# Patient Record
Sex: Female | Born: 1995 | Race: Black or African American | Hispanic: No | Marital: Single | Smoking: Current every day smoker
Health system: Southern US, Community
[De-identification: ages and names within clinical notes are randomized; demographics above are authoritative.]

## PROBLEM LIST (undated history)

## (undated) DIAGNOSIS — Z789 Other specified health status: Secondary | ICD-10-CM

## (undated) DIAGNOSIS — H9190 Unspecified hearing loss, unspecified ear: Secondary | ICD-10-CM

## (undated) HISTORY — PX: NO PAST SURGERIES: SHX2092

---
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
## 2017-10-07 ENCOUNTER — Encounter (HOSPITAL_COMMUNITY): Payer: Self-pay | Admitting: Emergency Medicine

## 2017-10-07 ENCOUNTER — Emergency Department (HOSPITAL_COMMUNITY)
Admission: EM | Admit: 2017-10-07 | Discharge: 2017-10-08 | Disposition: A | Discharge status: Home / Self Care | Payer: Medicaid Other | Attending: Emergency Medicine | Admitting: Emergency Medicine

## 2017-10-07 DIAGNOSIS — R109 Unspecified abdominal pain: Principal | ICD-10-CM

## 2017-10-07 DIAGNOSIS — F17200 Nicotine dependence, unspecified, uncomplicated: Secondary | ICD-10-CM

## 2017-10-07 DIAGNOSIS — N898 Other specified noninflammatory disorders of vagina: Secondary | ICD-10-CM

## 2017-10-07 LAB — URINALYSIS, ROUTINE W REFLEX MICROSCOPIC
BILIRUBIN URINE: NEGATIVE
Glucose, UA: NEGATIVE mg/dL
KETONES UR: NEGATIVE mg/dL
Nitrite: NEGATIVE
PH: 6 (ref 5.0–8.0)
Protein, ur: NEGATIVE mg/dL
Specific Gravity, Urine: 1.021 (ref 1.005–1.030)

## 2017-10-07 LAB — COMPREHENSIVE METABOLIC PANEL
ALT: 14 U/L (ref 14–54)
ANION GAP: 8 (ref 5–15)
AST: 19 U/L (ref 15–41)
Albumin: 4.2 g/dL (ref 3.5–5.0)
Alkaline Phosphatase: 69 U/L (ref 38–126)
BUN: 8 mg/dL (ref 6–20)
CO2: 22 mmol/L (ref 22–32)
Calcium: 9.3 mg/dL (ref 8.9–10.3)
Chloride: 106 mmol/L (ref 101–111)
Creatinine, Ser: 0.79 mg/dL (ref 0.44–1.00)
GFR calc Af Amer: >60 mL/min (ref >60)
Glucose, Bld: 98 mg/dL (ref 65–99)
POTASSIUM: 3.5 mmol/L (ref 3.5–5.1)
SODIUM: 136 mmol/L (ref 135–145)
Total Bilirubin: 0.4 mg/dL (ref 0.3–1.2)
Total Protein: 7.1 g/dL (ref 6.5–8.1)

## 2017-10-07 LAB — CBC WITH DIFFERENTIAL/PLATELET
Basophils Absolute: 0 10*3/uL (ref 0.0–0.1)
Basophils Relative: 0 %
EOS ABS: 0.3 10*3/uL (ref 0.0–0.7)
EOS PCT: 3 %
HCT: 36.6 % (ref 36.0–46.0)
Hemoglobin: 11.9 g/dL — ABNORMAL LOW (ref 12.0–15.0)
LYMPHS PCT: 36 %
Lymphs Abs: 3.5 10*3/uL (ref 0.7–4.0)
MCH: 27.4 pg (ref 26.0–34.0)
MCHC: 32.5 g/dL (ref 30.0–36.0)
MCV: 84.3 fL (ref 78.0–100.0)
MONO ABS: 0.5 10*3/uL (ref 0.1–1.0)
Monocytes Relative: 5 %
Neutro Abs: 5.6 10*3/uL (ref 1.7–7.7)
Neutrophils Relative %: 56 %
PLATELETS: 300 10*3/uL (ref 150–400)
RBC: 4.34 MIL/uL (ref 3.87–5.11)
RDW: 13.5 % (ref 11.5–15.5)
WBC: 10 10*3/uL (ref 4.0–10.5)

## 2017-10-07 LAB — I-STAT BETA HCG BLOOD, ED (MC, WL, AP ONLY): I-stat hCG, quantitative: <5 m[IU]/mL (ref <5)

## 2017-10-07 NOTE — ED Provider Notes (Signed)
[facility]ENCY DEPT Provider Note   CSN: [id number] Arrival date & time: 10/07/17  1957     History   Chief Complaint Chief Complaint  Patient presents with  . Flank Pain    HPI Karen [name] is a 21 y.o. female.  HPI   Presents with right sided back pain Lower abdominal cramping Started on Friday, was severe Friday, since then has improved Has a history of kidney stones and this felt similar Back pain now is about a 7/10 No current abdominal pain, it occurs about 3 times daily and lasts about 1 minute. No dysuria Did note some blood in the urine earlier today Ibuprofen helping with pain  Vaginal discharge, with odor for 3 weeks No fevers Sexually active, not using condoms  History reviewed. No pertinent past medical history.  There are no active problems to display for this patient.   History reviewed. No pertinent surgical history.  OB History    No data available       Home Medications    Prior to Admission medications   Medication Sig Start Date End Date Taking? Authorizing Provider  acetaminophen (TYLENOL) 500 MG tablet Take 500 mg by mouth every 6 (six) hours as needed (pain).    [provider]  cephALEXin (KEFLEX) 500 MG capsule Take 1 capsule (500 mg total) by mouth 3 (three) times daily. 08/04/14   Santiago Glad, PA-C  ondansetron (ZOFRAN ODT) 4 MG disintegrating tablet Take 1 tablet (4 mg total) by mouth every 8 (eight) hours as needed for nausea or vomiting. 10/08/17   Alvira Monday, MD  tamsulosin (FLOMAX) 0.4 MG CAPS capsule Take 1 capsule (0.4 mg total) by mouth daily. 10/08/17 10/22/17  Alvira Monday, MD  traMADol (ULTRAM) 50 MG tablet Take 1 tablet (50 mg total) by mouth every 6 (six) hours as needed. 08/04/14   Santiago Glad, PA-C    Family History No family history on file.  Social History Social History  Substance Use Topics  . Smoking status: Current Some Day Smoker  . Smokeless tobacco: Never Used  . Alcohol  use Yes     Allergies   Patient has no known allergies.   Review of Systems Review of Systems  Constitutional: Negative for fever.  HENT: Negative for sore throat.   Eyes: Negative for visual disturbance.  Respiratory: Negative for cough and shortness of breath.   Cardiovascular: Negative for chest pain.  Gastrointestinal: Positive for abdominal pain and nausea. Negative for constipation, diarrhea and vomiting.  Genitourinary: Positive for flank pain and vaginal discharge. Negative for difficulty urinating and dysuria.  Musculoskeletal: Negative for back pain and neck pain.  Skin: Negative for rash.  Neurological: Negative for syncope and headaches.     Physical Exam Updated Vital Signs BP 125/70 (BP Location: Right Arm)   Pulse 80   Temp 98.6 F (37 C) (Oral)   Resp 14   SpO2 100%   Physical Exam  Constitutional: She is oriented to person, place, and time. She appears well-developed and well-nourished. No distress.  HENT:  Head: Normocephalic and atraumatic.  Eyes: Conjunctivae and EOM are normal.  Neck: Normal range of motion.  Cardiovascular: Normal rate, regular rhythm, normal heart sounds and intact distal pulses.  Exam reveals no gallop and no friction rub.   No murmur heard. Pulmonary/Chest: Effort normal and breath sounds normal. No respiratory distress. She has no wheezes. She has no rales.  Abdominal: Soft. She exhibits no distension. There is no tenderness. There is CVA tenderness (right).  There is no guarding.  Genitourinary: Uterus is not tender. Cervix exhibits no motion tenderness. Right adnexum displays no tenderness. Left adnexum displays no tenderness. Vaginal discharge (scant) found.  Musculoskeletal: She exhibits no edema or tenderness.  Neurological: She is alert and oriented to person, place, and time.  Skin: Skin is warm and dry. No rash noted. She is not diaphoretic. No erythema.  Nursing note and vitals reviewed.    ED Treatments / Results    Labs (all labs ordered are listed, but only abnormal results are displayed) Labs Reviewed  WET PREP, GENITAL - Abnormal; Notable for the following:       Result Value   WBC, Wet Prep HPF POC MANY (*)    All other components within normal limits  URINALYSIS, ROUTINE W REFLEX MICROSCOPIC - Abnormal; Notable for the following:    Hgb urine dipstick SMALL (*)    Leukocytes, UA TRACE (*)    Bacteria, UA RARE (*)    Squamous Epithelial / LPF 0-5 (*)    All other components within normal limits  CBC WITH DIFFERENTIAL/PLATELET - Abnormal; Notable for the following:    Hemoglobin 11.9 (*)    All other components within normal limits  COMPREHENSIVE METABOLIC PANEL  I-STAT BETA HCG BLOOD, ED ([facility], [facility], [facility] ONLY)  GC/CHLAMYDIA PROBE AMP ([place]) NOT AT [facility]    EKG  EKG Interpretation None       Radiology No results found.  Procedures Procedures (including critical care time)  Medications Ordered in ED Medications  ketorolac (TORADOL) injection 60 mg (60 mg Intramuscular Given 10/08/17 0018)  cefTRIAXone (ROCEPHIN) injection 250 mg (250 mg Intramuscular Given 10/08/17 0108)  azithromycin (ZITHROMAX) tablet 1,000 mg (1,000 mg Oral Given 10/08/17 0108)     Initial Impression / Assessment and Plan / ED Course  I have reviewed the triage vital signs and the nursing notes.  Pertinent labs & imaging results that were available during my care of the patient were reviewed by me and considered in my medical decision making (see chart for details).    21yo female with history of nephrolithiasis presents with concern for right flank pain. No sign of UTI. Cr WNL.   Small hgb on urinalysis. Patient reports persisting pain and has right CVA tenderness, and given history of nephrolithiasis and this feeling similar feel this is most likely etiology of her pain.  Discussed that we could obtain US/XR to confirm and evaluate diagnosis, however given her pain is well controlled, she has no signs  of infection or AKI, the testing will not likely change course of care. Patient agrees to forego further testing and to be treated as nephrolithiasis with urology follow up if pain continues and return to the ED for any new or worsening symptoms.   She has good pain control with ibuprofen and recommend continuing this and tylenol and gave rx for flomax.    She reports occasional lower abdominal cramping that lasts for a second then resolves. Pregnancy test negative.  Pelvic exam benign. Swabs done and given empiric treatment for gc/chl after discussion with patient.  No signs of PID, TOA. Doubt torsion at this time given patient appears comfortable and does not have significant duration of lower cramping episodes.    Discussed strict return precautions.  Patient discharged in stable condition with understanding of reasons to return.   Final Clinical Impressions(s) / ED Diagnoses   Final diagnoses:  Right flank pain, suspect likely kidney stone  Vaginal discharge  New Prescriptions Discharge Medication List as of 10/08/2017 12:53 AM    START taking these medications   Details  ondansetron (ZOFRAN ODT) 4 MG disintegrating tablet Take 1 tablet (4 mg total) by mouth every 8 (eight) hours as needed for nausea or vomiting., Starting Wed 10/08/2017, Print    tamsulosin (FLOMAX) 0.4 MG CAPS capsule Take 1 capsule (0.4 mg total) by mouth daily., Starting Wed 10/08/2017, Until Wed 10/22/2017, Print         Alvira Monday, MD 10/08/17 [phone]

## 2017-10-07 NOTE — ED Triage Notes (Signed)
Patient reports right flank pain onset last Friday with mild hematuria , denies dysuria or fever . Patient also requesting STD screening she added intermittent low abdominal cramping .

## 2017-10-08 LAB — WET PREP, GENITAL
Clue Cells Wet Prep HPF POC: NONE SEEN
Sperm: NONE SEEN
Trich, Wet Prep: NONE SEEN
YEAST WET PREP: NONE SEEN

## 2017-10-08 LAB — GC/CHLAMYDIA PROBE AMP (~~LOC~~) NOT AT ARMC
CHLAMYDIA, DNA PROBE: NEGATIVE
NEISSERIA GONORRHEA: NEGATIVE

## 2017-10-08 MED ORDER — LIDOCAINE HCL (PF) 1 % IJ SOLN
INTRAMUSCULAR | Status: AC
  Filled 2017-10-08: qty 5

## 2017-10-08 MED ORDER — TAMSULOSIN HCL 0.4 MG PO CAPS: 0.4000 mg | ORAL_CAPSULE | Freq: Every day | ORAL | 0 refills | Status: AC

## 2017-10-08 MED ORDER — ONDANSETRON 4 MG PO TBDP: 4.0000 mg | ORAL_TABLET | Freq: Three times a day (TID) | ORAL | 0 refills | Status: DC | PRN

## 2017-10-08 MED ORDER — AZITHROMYCIN 250 MG PO TABS
1000.0000 mg | ORAL_TABLET | Freq: Once | ORAL | Status: AC
  Administered 2017-10-08: 1000 mg via ORAL
  Filled 2017-10-08: qty 4

## 2017-10-08 MED ORDER — KETOROLAC TROMETHAMINE 60 MG/2ML IM SOLN
60.0000 mg | Freq: Once | INTRAMUSCULAR | Status: AC
  Administered 2017-10-08: 60 mg via INTRAMUSCULAR
  Filled 2017-10-08: qty 2

## 2017-10-08 MED ORDER — CEFTRIAXONE SODIUM 250 MG IJ SOLR
250.0000 mg | Freq: Once | INTRAMUSCULAR | Status: AC
  Administered 2017-10-08: 250 mg via INTRAMUSCULAR
  Filled 2017-10-08: qty 250

## 2017-10-08 NOTE — Discharge Instructions (Signed)
Take Tylenol 1000 mg 4 times a day for 1 week. This is the maximum dose of Tylenol (acetaminophen) you can take from all sources. Please check other over-the-counter medications and prescriptions to ensure you are not taking other medications that contain acetaminophen.  You may also take ibuprofen 400 mg 6 times a day alternating with or at the same time as tylenol or  four times daily.

## 2018-12-30 NOTE — L&D Delivery Note (Addendum)
Delivery Note Karen [name] is a 23 y.o. G2P1001 at [redacted]w[redacted]d admitted for spontaneous rupture of membranes.  Labor course: Uncomplicated. Patient received one dose of cytotec and had a foley bulb. After foley bulb fell out, patient started on Pitocin and rapidly progressed to C/C/+2 ROM: 13h 83m with clear fluid  At 0239 a viable boy was delivered via spontaneous vaginal delivery (Presentation: vertex; ROA). Infant placed directly on mom's abdomen for bonding/skin-to-skin. Delayed cord clamping x 4min, then cord clamped x 2, and cut by the patient.  APGAR: 9,9; weight: pending at time of note. 40 units of pitocin diluted in 1000cc LR was infused rapidly IV per protocol. The placenta separated spontaneously and delivered via CCT and maternal pushing effort.  It was inspected and appears to be intact with a 3 VC.  Placenta/Cord with the following complications: none. Cord pH: n/a  Intrapartum complications:  None Anesthesia:  epidural Episiotomy: none Lacerations:  none Suture Repair: n/a Est. Blood Loss (mL): 104 Sponge and instrument count were correct x2.  Mom to postpartum.  Baby to Couplet care / Skin to Skin. Placenta to L&D. Plans to breast feed Contraception: undecided Circ: outpatient  [name] [facility] 11/08/2019 3:10 AM   Patient is a 23 y.o. at [redacted]w[redacted]d who was admitted for SROM,  uncomplicated prenatal course.  She progressed with augmentation via cyotec, FB, pitocin and AROM.  I was gloved and present for delivery in its entirety.  Second stage of labor progressed, baby delivered after 2 contractions.   Complications: none  [name], CNM 3:23 AM

## 2019-05-28 ENCOUNTER — Inpatient Hospital Stay (HOSPITAL_COMMUNITY)
Admission: AD | Admit: 2019-05-28 | Discharge: 2019-05-28 | Disposition: A | Discharge status: Home / Self Care | Payer: Medicaid Other | Attending: Obstetrics and Gynecology | Admitting: Obstetrics and Gynecology

## 2019-05-28 ENCOUNTER — Other Ambulatory Visit: Payer: Self-pay

## 2019-05-28 ENCOUNTER — Encounter (HOSPITAL_COMMUNITY): Payer: Self-pay | Admitting: *Deleted

## 2019-05-28 DIAGNOSIS — Z87891 Personal history of nicotine dependence: Secondary | ICD-10-CM | POA: Diagnosis not present

## 2019-05-28 DIAGNOSIS — O26892 Other specified pregnancy related conditions, second trimester: Secondary | ICD-10-CM

## 2019-05-28 DIAGNOSIS — K6289 Other specified diseases of anus and rectum: Secondary | ICD-10-CM | POA: Insufficient documentation

## 2019-05-28 DIAGNOSIS — Z3A14 14 weeks gestation of pregnancy: Secondary | ICD-10-CM | POA: Insufficient documentation

## 2019-05-28 DIAGNOSIS — O9989 Other specified diseases and conditions complicating pregnancy, childbirth and the puerperium: Secondary | ICD-10-CM | POA: Diagnosis present

## 2019-05-28 HISTORY — DX: Unspecified hearing loss, unspecified ear: H91.90

## 2019-05-28 LAB — URINALYSIS, ROUTINE W REFLEX MICROSCOPIC
Bilirubin Urine: NEGATIVE
Glucose, UA: NEGATIVE mg/dL
Ketones, ur: NEGATIVE mg/dL
Leukocytes,Ua: NEGATIVE
Nitrite: NEGATIVE
Protein, ur: NEGATIVE mg/dL
Specific Gravity, Urine: 1.021 (ref 1.005–1.030)
pH: 7 (ref 5.0–8.0)

## 2019-05-28 NOTE — MAU Note (Signed)
Report given to Lew Dawes RN charge nurse [place]- per Nugent NP, pt is transfer  Then pt changed her mind and would like to have referral instead. Notified Nugent NP.

## 2019-05-28 NOTE — MAU Note (Signed)
Karen [name] is a 23 y.o. at [redacted]w[redacted]d here in MAU reporting:lower abdominal pain for a week  LMP: 02/19/19 Onset of complaint: a week  Pain score: 10 Vitals:   05/28/19 1228  BP: (!) 148/67  Pulse: 88  Resp: 16  Temp: 98.2 F (36.8 C)      Lab orders placed from triage:UA

## 2019-05-28 NOTE — MAU Provider Note (Addendum)
History     CSN: [id number]  Arrival date and time: 05/28/19 1133   First Provider Initiated Contact with Patient 05/28/19 1257      Chief Complaint  Patient presents with  . Abdominal Pain   Karen [name] is a 23 y.o. G2P1001 at [redacted]w[redacted]d who presents to MAU for abdominal pain. When pt was asked to point to the exact location of the pain, pt points to her glutes. Upon clarification, pt reports she thought her rectum was called her abdomen and reports she is experiencing pain deep within the rectum. Pt reports this has been happening every night for the past week. Her last BM was this morning and pt reports she has a bowel movement q2days and feels like she completely empties her bowels each time. Pt denies bleeding with defecation, anal intercourse, or insertion of anything into the rectum. Pt reports bowel movements appear normal, nothing makes the pain better or worse, and she has not tried any treatments at home.  Pt denies VB, vaginal discharge/odor/itching. Pt denies N/V, abdominal pain, constipation, diarrhea, or urinary problems. Pt denies fever, chills, fatigue, sweating or changes in appetite. Pt denies SOB or chest pain. Pt denies dizziness, HA, light-headedness, weakness.  Prenatal care provider? None - pt requests list of OB providers   OB History    Gravida  2   Para  1   Term  1   Preterm      AB      Living  1     SAB      TAB      Ectopic      Multiple      Live Births  1           Past Medical History:  Diagnosis Date  . 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 NO PAST SURGERIES      History reviewed. No pertinent family history.  Social History   Tobacco Use  . Smoking status: Former Smoker    Types: Cigars    Last attempt to quit: 03/31/2019    Years since quitting: 0.1  . Smokeless tobacco: Never Used  Substance Use Topics  . Alcohol use: Not  Currently    Frequency: Never    Comment: last time was in early April 2020  . Drug use: Never    Allergies: No Known Allergies  No medications prior to admission.    Review of Systems  Constitutional: Negative for chills, diaphoresis and fatigue.  Respiratory: Negative for shortness of breath.   Cardiovascular: Negative for chest pain.  Gastrointestinal: Positive for rectal pain. Negative for abdominal pain, anal bleeding, blood in stool, constipation, diarrhea, nausea and vomiting.  Genitourinary: Negative for dysuria, flank pain, frequency, pelvic pain, vaginal bleeding and vaginal discharge.  Neurological: Negative for dizziness, weakness, light-headedness and headaches.   Physical Exam   Blood pressure 100/60, pulse 87, temperature 98 F (36.7 C), temperature source Oral, resp. rate 18, last menstrual period 02/19/2019, SpO2 98 %.  Patient Vitals for the past 24 hrs:  BP Temp Temp src Pulse Resp SpO2  05/28/19 1353 100/60 - - 87 18 -  05/28/19 1253 111/66 - - 84 - -  05/28/19 1252 101/67 98 F (36.7 C) Oral 92 16 98 %  05/28/19 1228 (!) 148/67 98.2 F (36.8 C) - 88 16 -   Physical Exam  Constitutional:  She is oriented to person, place, and time. She appears well-developed and well-nourished. No distress.  HENT:  Head: Normocephalic and atraumatic.  Respiratory: Effort normal.  GI: Soft. She exhibits no distension and no mass. There is no abdominal tenderness. There is no rebound and no guarding.  Genitourinary:    Genitourinary Comments: Rectum normal in appearance, no lesions, masses or bleeding   Neurological: She is alert and oriented to person, place, and time.  Skin: Skin is warm and dry. She is not diaphoretic.  Psychiatric: She has a normal mood and affect. Her behavior is normal. Judgment and thought content normal.   Results for orders placed or performed during the hospital encounter of 05/28/19 (from the past 24 hour(s))  Urinalysis, Routine w reflex  microscopic     Status: Abnormal   Collection Time: 05/28/19 12:50 PM  Result Value Ref Range   Color, Urine YELLOW YELLOW   APPearance HAZY (A) CLEAR   Specific Gravity, Urine 1.021 1.005 - 1.030   pH 7.0 5.0 - 8.0   Glucose, UA NEGATIVE NEGATIVE mg/dL   Hgb urine dipstick SMALL (A) NEGATIVE   Bilirubin Urine NEGATIVE NEGATIVE   Ketones, ur NEGATIVE NEGATIVE mg/dL   Protein, ur NEGATIVE NEGATIVE mg/dL   Nitrite NEGATIVE NEGATIVE   Leukocytes,Ua NEGATIVE NEGATIVE   RBC / HPF 0-5 0 - 5 RBC/hpf   WBC, UA 0-5 0 - 5 WBC/hpf   Bacteria, UA RARE (A) NONE SEEN   Squamous Epithelial / LPF 0-5 0 - 5   Mucus PRESENT    No results found.  MAU Course  Procedures  MDM -deep rectal pain -normal exam -UA: hazy/sm hgb/rare bacteria -FHT: 147 -pt offered transfer to ED vs. referrl to GI, pt accepts transfer to ED, ED provider called with report -upon preparing to transfer pt, pt declines transfer to ED and requests referral to GI instead -pt discharged to home in stable condition  Orders Placed This Encounter  Procedures  . Urinalysis, Routine w reflex microscopic    Standing Status:   Standing    Number of Occurrences:   1  . Ambulatory referral to Gastroenterology    Referral Priority:   Routine    Referral Type:   Consultation    Referral Reason:   Specialty Services Required    Number of Visits Requested:   1  . Discharge patient    Order Specific Question:   Discharge disposition    Answer:   01-Home or Self Care [1]    Order Specific Question:   Discharge patient date    Answer:   05/28/2019   No orders of the defined types were placed in this encounter.  Assessment and Plan   1. Rectal pain   2. [redacted] weeks gestation of pregnancy    Allergies as of 05/28/2019   No Known Allergies     Medication List    You have not been prescribed any medications.    -start PNVs -list of OB providers given -pt to receive call from GI to schedule appt -MAU return precautions  discussed -pt discharged to home in stable condition  Joni Reiningicole E Bransyn Adami 05/28/2019, 3:08 PM

## 2019-05-28 NOTE — Discharge Instructions (Signed)
[facility]  ° ° ° °[facility]     Phone: [phone] ° °[facility]  Phone: [phone] ° °[facility] [place]  Phone: [phone] ° °[facility]                           Phone: [phone] ° °[facility]          Phone: [phone] ° °[facility]    Phone: [phone]  ° °[facility]    Phone: [phone] ° °[facility]    Phone: [phone] ° °[facility]    Phone: [phone] ° °[facility]    Phone: [phone] ° °[facility]  Phone: [phone] ° °[facility]               Phone: [phone] ° °[facility]    Phone: [phone] ° °[facility]    Phone: [phone] ° ° ° ° ° ° ° ° °  ° ° ° °  ° ° °

## 2019-05-31 ENCOUNTER — Encounter (HOSPITAL_COMMUNITY): Payer: Self-pay | Admitting: *Deleted

## 2019-05-31 ENCOUNTER — Encounter (HOSPITAL_COMMUNITY): Payer: Self-pay

## 2019-06-11 ENCOUNTER — Ambulatory Visit (INDEPENDENT_AMBULATORY_CARE_PROVIDER_SITE_OTHER): Payer: Medicaid Other | Admitting: Gastroenterology

## 2019-06-11 ENCOUNTER — Other Ambulatory Visit: Payer: Self-pay

## 2019-06-11 DIAGNOSIS — O99012 Anemia complicating pregnancy, second trimester: Secondary | ICD-10-CM | POA: Insufficient documentation

## 2019-06-11 DIAGNOSIS — H9192 Unspecified hearing loss, left ear: Secondary | ICD-10-CM | POA: Insufficient documentation

## 2019-06-11 DIAGNOSIS — K6289 Other specified diseases of anus and rectum: Secondary | ICD-10-CM

## 2019-06-11 LAB — OB RESULTS CONSOLE HGB/HCT, BLOOD: Hemoglobin: 10.9

## 2019-06-11 LAB — OB RESULTS CONSOLE HIV ANTIBODY (ROUTINE TESTING): HIV: NONREACTIVE

## 2019-06-11 LAB — HM PAP SMEAR

## 2019-06-11 NOTE — Progress Notes (Signed)
Patient referred after recent visit to obstetric emergency department with a week of nocturnal rectal pain.  She is currently [redacted] weeks pregnant.  The patient had been set up for a telemedicine visit today.  Unfortunately, she did not reply to a text message with a link to video conference, nor to a subsequent phone call to her mobile device.

## 2019-06-11 NOTE — Progress Notes (Deleted)
**Karen [name] De-Identified via Obfuscation** This patient contacted our office requesting a physician telemedicine consultation regarding clinical questions and/or test results.  If new patient, they were referred by inpatient gynecology service  Participants on the conference : myself and patient   The patient consented to this consultation and was aware that a charge will be placed through their insurance.  I was in my office and the patient was at home   Encounter time:  Total time *** minutes, with *** minutes spent with patient on ***   _____________________________________________________________________________________________            [facility] Karen [name]  History: Karen Karen [name] 06/11/2019  Referring provider: Patient, No Pcp Per  Reason for consult/chief complaint: No chief complaint on file.   Subjective  HPI: This patient was in the obstetric emergency department on 05/28/2019 at [redacted] weeks gestation complaining of pelvic/rectal pain nightly for about a week.  [name]  Bingharlie Pickens, a rectal exam was normal.  Urinalysis normal.  [facility], patient then apparently changed her mind and requested outpatient GI evaluation.  No additional laboratory testing or imaging was performed.  ***   ROS:  Review of Systems   Past Medical History: Past Medical History:  Diagnosis Date  . 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 NO PAST SURGERIES       Family History: No family history on file.  Social History: Social History   Socioeconomic History  . Marital status: Single    Spouse name: Not on file  . Number of children: Not on file  . Years of education: Not on file  . Highest education level: Not on file  Occupational History  . Not on file  Social Needs  .  Financial resource strain: Not on file  . Food insecurity    Worry: Not on file    Inability: Not on file  . Transportation needs    Medical: Not on file    Non-medical: Not on file  Tobacco Use  . Smoking status: Former Smoker    Types: Cigars    Quit date: 03/31/2019    Years since quitting: 0.1  . Smokeless tobacco: Never Used  Substance and Sexual Activity  . Alcohol use: Not Currently    Frequency: Never    Comment: last time was in early April 2020  . Drug use: Never  . Sexual activity: Yes  Lifestyle  . Physical activity    Days per week: Not on file    Minutes per session: Not on file  . Stress: Not on file  Relationships  . Social Musicianconnections    Talks on phone: Not on file    Gets together: Not on file    Attends religious service: Not on file    Active member of club or organization: Not on file    Attends meetings of clubs or organizations: Not on file    Relationship status: Not on file  Other Topics Concern  . Not on file  Social History Narrative   ** Merged History Encounter **        Allergies: No Known Allergies  Outpatient Meds: Current Outpatient Medications  Medication Sig Dispense Refill  . acetaminophen (TYLENOL) 500 MG tablet Take 500 mg by mouth every 6 (six) hours as needed (pain).    .Marland Kitchen  cephALEXin (KEFLEX) 500 MG capsule Take 1 capsule (500 mg total) by mouth 3 (three) times daily. 30 capsule 0  . ondansetron (ZOFRAN ODT) 4 MG disintegrating tablet Take 1 tablet (4 mg total) by mouth every 8 (eight) hours as needed for nausea or vomiting. 20 tablet 0  . traMADol (ULTRAM) 50 MG tablet Take 1 tablet (50 mg total) by mouth every 6 (six) hours as needed. 15 tablet 0   No current facility-administered medications for this visit.       ___________________________________________________________________ Objective   Exam:  LMP 02/19/2019    General: ***   Eyes: sclera anicteric, no redness  ENT: oral mucosa moist without lesions, no  cervical or supraclavicular lymphadenopathy  CV: RRR without murmur, S1/S2, no JVD, no peripheral edema  Resp: clear to auscultation bilaterally, normal RR and effort noted  GI: soft, *** tenderness, with active bowel sounds. No guarding or palpable organomegaly noted.  Skin; warm and dry, no rash or jaundice noted  Neuro: awake, alert and oriented x 3. Normal gross motor function and fluent speech  Labs:  ***  Radiologic Studies:  ***  Assessment: No diagnosis found.  ***  Plan:  ***  Thank you for the courtesy of this consult.  Please call me with any questions or concerns.  Nelida Meuse III  CC: Referring provider noted above

## 2019-06-12 LAB — OB RESULTS CONSOLE HGB/HCT, BLOOD: HCT: 34 (ref 29–41)

## 2019-06-12 LAB — IRON,TIBC AND FERRITIN PANEL
%SAT: 23
Ferritin: 45
Iron: 98
TIBC: 435

## 2019-06-12 LAB — OB RESULTS CONSOLE RPR: RPR: NONREACTIVE

## 2019-06-12 LAB — OB RESULTS CONSOLE ANTIBODY SCREEN: Antibody Screen: NEGATIVE

## 2019-06-12 LAB — OB RESULTS CONSOLE HEPATITIS B SURFACE ANTIGEN: Hepatitis B Surface Ag: NEGATIVE

## 2019-06-12 LAB — OB RESULTS CONSOLE PLATELET COUNT: Platelets: 264

## 2019-06-12 LAB — OB RESULTS CONSOLE ABO/RH: RH Type: POSITIVE

## 2019-06-16 DIAGNOSIS — O99820 Streptococcus B carrier state complicating pregnancy: Secondary | ICD-10-CM | POA: Insufficient documentation

## 2019-06-16 HISTORY — DX: Streptococcus B carrier state complicating pregnancy: O99.820

## 2019-06-16 LAB — OB RESULTS CONSOLE GBS: GBS: POSITIVE

## 2019-06-17 LAB — OB RESULTS CONSOLE GC/CHLAMYDIA
Chlamydia: NEGATIVE
Gonorrhea: NEGATIVE

## 2019-06-17 LAB — SICKLE CELL SCREEN: Sickle Cell Screen: NEGATIVE

## 2019-06-18 ENCOUNTER — Encounter: Payer: Medicaid Other | Admitting: Obstetrics & Gynecology

## 2019-07-14 DIAGNOSIS — O99012 Anemia complicating pregnancy, second trimester: Secondary | ICD-10-CM

## 2019-07-14 DIAGNOSIS — Z3480 Encounter for supervision of other normal pregnancy, unspecified trimester: Secondary | ICD-10-CM | POA: Insufficient documentation

## 2019-07-14 DIAGNOSIS — H9192 Unspecified hearing loss, left ear: Secondary | ICD-10-CM

## 2019-07-14 DIAGNOSIS — O99820 Streptococcus B carrier state complicating pregnancy: Secondary | ICD-10-CM

## 2019-07-16 ENCOUNTER — Ambulatory Visit: Payer: Medicaid Other

## 2019-07-23 ENCOUNTER — Telehealth: Payer: Self-pay | Admitting: Family Medicine

## 2019-07-23 ENCOUNTER — Ambulatory Visit: Payer: Medicaid Other

## 2019-07-28 NOTE — Telephone Encounter (Signed)
2nd attempt to Call Pt to reschedule missed appt and no answer was not able to LVM

## 2019-07-29 NOTE — Telephone Encounter (Signed)
3rd attempt: Called PT  to reschedule misssed appt, PT answered and said: oh she was like sorry and then she hung up.

## 2019-08-02 NOTE — Telephone Encounter (Signed)
Phone call to patient to reschedule missed appt. No answer, unable to leave a message due to phone message of "this person has a voicemail box that has not been set up yet. Hal Morales, RN

## 2019-08-04 NOTE — Telephone Encounter (Signed)
Phone call to client as per EHR. Per recorded message, voicemail is not set up. Phone call to client at home number [phone]) and per recorded message, number can't accept calls at this time. Phone call to emergency contact [phone]) and per recorded message, number can't accept calls at this time. Shona Needles, RN

## 2019-08-11 ENCOUNTER — Emergency Department (HOSPITAL_COMMUNITY)
Admission: EM | Admit: 2019-08-11 | Discharge: 2019-08-11 | Disposition: A | Discharge status: Home / Self Care | Payer: Medicaid Other | Attending: Emergency Medicine | Admitting: Emergency Medicine

## 2019-08-11 ENCOUNTER — Other Ambulatory Visit: Payer: Self-pay

## 2019-08-11 ENCOUNTER — Encounter (HOSPITAL_COMMUNITY): Payer: Self-pay

## 2019-08-11 DIAGNOSIS — Z3A24 24 weeks gestation of pregnancy: Secondary | ICD-10-CM | POA: Diagnosis not present

## 2019-08-11 DIAGNOSIS — O209 Hemorrhage in early pregnancy, unspecified: Secondary | ICD-10-CM | POA: Diagnosis not present

## 2019-08-11 DIAGNOSIS — R109 Unspecified abdominal pain: Secondary | ICD-10-CM | POA: Insufficient documentation

## 2019-08-11 DIAGNOSIS — Z87891 Personal history of nicotine dependence: Secondary | ICD-10-CM | POA: Insufficient documentation

## 2019-08-11 DIAGNOSIS — O4690 Antepartum hemorrhage, unspecified, unspecified trimester: Secondary | ICD-10-CM

## 2019-08-11 LAB — URINALYSIS, ROUTINE W REFLEX MICROSCOPIC
Bilirubin Urine: NEGATIVE
Glucose, UA: NEGATIVE mg/dL
Hgb urine dipstick: NEGATIVE
Ketones, ur: NEGATIVE mg/dL
Leukocytes,Ua: NEGATIVE
Nitrite: NEGATIVE
Protein, ur: NEGATIVE mg/dL
Specific Gravity, Urine: 1.02 (ref 1.005–1.030)
pH: 7 (ref 5.0–8.0)

## 2019-08-11 LAB — TYPE AND SCREEN
ABO/RH(D): B POS
Antibody Screen: NEGATIVE

## 2019-08-11 LAB — WET PREP, GENITAL
Clue Cells Wet Prep HPF POC: NONE SEEN
Sperm: NONE SEEN
Trich, Wet Prep: NONE SEEN
Yeast Wet Prep HPF POC: NONE SEEN

## 2019-08-11 LAB — HCG, QUANTITATIVE, PREGNANCY: hCG, Beta Chain, Quant, S: 5505 m[IU]/mL — ABNORMAL HIGH (ref <5)

## 2019-08-11 LAB — ABO/RH: ABO/RH(D): B POS

## 2019-08-11 MED ORDER — LIDOCAINE HCL (PF) 1 % IJ SOLN
INTRAMUSCULAR | Status: AC
  Administered 2019-08-11: 14:00:00 2 mL
  Filled 2019-08-11: qty 2

## 2019-08-11 MED ORDER — AZITHROMYCIN 250 MG PO TABS
1000.0000 mg | ORAL_TABLET | Freq: Once | ORAL | Status: AC
  Administered 2019-08-11: 1000 mg via ORAL
  Filled 2019-08-11: qty 4

## 2019-08-11 MED ORDER — ONDANSETRON 4 MG PO TBDP
4.0000 mg | ORAL_TABLET | Freq: Once | ORAL | Status: AC
  Administered 2019-08-11: 16:00:00 4 mg via ORAL
  Filled 2019-08-11: qty 1

## 2019-08-11 MED ORDER — CEFTRIAXONE SODIUM 250 MG IJ SOLR
250.0000 mg | Freq: Once | INTRAMUSCULAR | Status: AC
  Administered 2019-08-11: 14:00:00 250 mg via INTRAMUSCULAR
  Filled 2019-08-11: qty 250

## 2019-08-11 NOTE — Discharge Instructions (Addendum)
Please call today and follow up closely with OBGYN for further management of your pregnancy.

## 2019-08-11 NOTE — ED Provider Notes (Signed)
[facility] EMERGENCY DEPARTMENT Provider Note   CSN: [id number] Arrival date & time: 08/11/19  1059     History   Chief Complaint Chief Complaint  Patient presents with  . Abdominal Cramping  . Vaginal Bleeding    HPI Karen [name] is a 23 y.o. female.     The history is provided by the patient. No language interpreter was used.  Abdominal Cramping  Vaginal Bleeding    23 year old female G2, P1 reportedly 6 months pregnant presenting for evaluation of abdominal cramping.  Patient states she always had lower abdominal cramping however this morning her pain intensified.  She described her pain as a stretching sensation, worse when she sits up or when she walks and improves when she lays down.  She also noticed trace of vaginal spotting this morning.  She mention the pain does radiate to her back.  She also report having a light yellowish discharge ongoing for the past 2 weeks.  She admits to having unprotected sex from the same partner 2 days ago.  She denies any fever chills no nausea vomiting diarrhea chest pain shortness of breath dysuria or rash.  She has had a formal ultrasound in July which confirms the sex of the baby.  She mention normal baby movement.  Patient report her pain is currently minimal.  She also reports some occasional rectal discomfort with her abdominal pain.  No specific treatment tried.     Past Medical History:  Diagnosis Date  . 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 Encounter for supervision of normal pregnancy in multigravida, antepartum 07/14/2019  . 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 Hearing loss in left ear 06/11/2019  . Anemia complicating pregnancy in second trimester 06/11/2019    Past Surgical History:  Procedure Laterality Date  . NO PAST SURGERIES       OB History    Gravida  2   Para  1   Term  1   Preterm  0   AB  0   Living  1     SAB  0   TAB  0   Ectopic  0   Multiple      Live Births  1            Home Medications    Prior to Admission medications   Medication Sig Start Date End Date Taking? Authorizing Provider  acetaminophen (TYLENOL) 500 MG tablet Take 500 mg by mouth every 6 (six) hours as needed (pain).    [provider]  cephALEXin (KEFLEX) 500 MG capsule Take 1 capsule (500 mg total) by mouth 3 (three) times daily. 08/04/14   Hyman Bible, PA-C  ondansetron (ZOFRAN ODT) 4 MG disintegrating tablet Take 1 tablet (4 mg total) by mouth every 8 (eight) hours as needed for nausea or vomiting. 10/08/17   Gareth Morgan, MD  traMADol (ULTRAM) 50 MG tablet Take 1 tablet (50 mg total) by mouth every 6 (six) hours as needed. 08/04/14   Hyman Bible, PA-C    Family History No family history on file.  Social History Social History   Tobacco Use  . Smoking status: Former Smoker    Types: Cigars    Quit date: 03/31/2019    Years since quitting: 0.3  . Smokeless tobacco: Never Used  Substance Use Topics  . Alcohol use: Not Currently  Frequency: Never    Comment: last time was in early April 2020  . Drug use: Never     Allergies   Patient has no known allergies.   Review of Systems Review of Systems  Genitourinary: Positive for vaginal bleeding.  All other systems reviewed and are negative.    Physical Exam Updated Vital Signs BP 117/68   Pulse (!) 133   Temp 99 F (37.2 C) (Oral)   Resp 16   LMP 02/19/2019   SpO2 99%   Physical Exam Vitals signs and nursing note reviewed.  Constitutional:      General: She is not in acute distress.    Appearance: She is well-developed.     Comments: Patient resting comfortably in no acute discomfort.  HENT:     Head: Atraumatic.  Eyes:     Conjunctiva/sclera: Conjunctivae normal.  Neck:     Musculoskeletal: Neck supple.  Cardiovascular:     Rate and Rhythm: Tachycardia present.      Pulses: Normal pulses.     Heart sounds: Normal heart sounds.  Pulmonary:     Effort: Pulmonary effort is normal.     Breath sounds: Normal breath sounds.  Abdominal:     Comments: Gravid abdomen, mild suprapubic tenderness no guarding or rebound tenderness.  Genitourinary:    Comments: Chaperone present during exam.  No inguinal lymphadenopathy or inguinal hernia noted.  Normal external genitalia.  Mild discomfort with speculum insertion.  Moderate amount of white discharge noted in vaginal vault.  Cervical os is closed.  No bleeding noted.  No tenderness to adnexal region on palpation and no cervical motion tenderness. Skin:    Findings: No rash.  Neurological:     Mental Status: She is alert.      ED Treatments / Results  Labs (all labs ordered are listed, but only abnormal results are displayed) Labs Reviewed  WET PREP, GENITAL - Abnormal; Notable for the following components:      Result Value   WBC, Wet Prep HPF POC MODERATE (*)    All other components within normal limits  HCG, QUANTITATIVE, PREGNANCY - Abnormal; Notable for the following components:   hCG, Beta Chain, Quant, S 5,505 (*)    All other components within normal limits  URINALYSIS, ROUTINE W REFLEX MICROSCOPIC  RPR  HIV ANTIBODY (ROUTINE TESTING W REFLEX)  ABO/RH  TYPE AND SCREEN  GC/CHLAMYDIA PROBE AMP () [facility]    EKG None  Radiology No results found.  Procedures Procedures (including critical care time)  Medications Ordered in ED Medications  ondansetron (ZOFRAN-ODT) disintegrating tablet 4 mg (has no administration in time range)  cefTRIAXone (ROCEPHIN) injection 250 mg (250 mg Intramuscular Given 08/11/19 1356)  azithromycin (ZITHROMAX) tablet 1,000 mg (1,000 mg Oral Given 08/11/19 1356)  lidocaine (PF) (XYLOCAINE) 1 % injection (2 mLs  Given 08/11/19 1356)     Initial Impression / Assessment and Plan / ED Course  I have reviewed the triage vital signs and the nursing  notes.  Pertinent labs & imaging results that were available during my care of the patient were reviewed by me and considered in my medical decision making (see chart for details).        BP 112/75   Pulse 92   Temp 99 F (37.2 C) (Oral)   Resp 16   LMP 02/19/2019   SpO2 97%    Final Clinical Impressions(s) / ED Diagnoses   Final diagnoses:  Vaginal bleeding in pregnancy  ED Discharge Orders    None     12:12 PM Patient is 6 months pregnant, in her second trimester here with vaginal spotting, lower abdominal cramping, and vaginal discharge.  She is resting comfortably at this time.  Work-up initiated.  1:28 PM Rapid OB nurse has been contacted.  On-call OB/GYN, Dr. Debroah LoopArnold has cleared pt on an OB perspective.  Pt can f/u with family practice OB for further care. Will obtain appropriate testing.    Sterile speculum exam revealed a closed cervical os, nondilated, no effacement, no obvious vaginal bleeding.  Moderate amount of vaginal discharge were noted.  Patient has no evidence of cervical motion tenderness or adnexal tenderness.  UA without signs of urinary tract infection.  Wet prep shows moderate WBC.  Given her lower of abdominal discomfort and vaginal discharge, will offer Rocephin and Zithromax to cover for potential STI.  Fetal heart tones shows good activity.  I have consulted with OBGYN Dr. Debroah LoopArnold who recommend outpt f/u.    Suspect round ligament pain causing her discomfort.  Patient does endorse nausea and vomited once.  Zofran given.  Reexamination of her abdomen she has some mild epigastric pain however I have low suspicion for biliary disease causing her symptoms.  Suspect gastritis.  Doubt appendicitis.   Fayrene Helperran, Shamyah Stantz, PA-C 08/11/19 1523    Bethann BerkshireZammit, Joseph, MD 08/17/19 1316

## 2019-08-11 NOTE — ED Triage Notes (Signed)
Pt reports she went to the restroom this am and noticed one spot of blood on the tissue, after that she noticed lower abd cramping. Was not feeling movement earlier today but is now feeling movement. Originally seen in [place] and got her first visit in and then moved out of the area, no other care.

## 2019-08-11 NOTE — ED Notes (Signed)
Erin at rapid response notified by [facility] RN

## 2019-08-11 NOTE — ED Notes (Signed)
Karen [name], RR nurse, states Dr Roselie Awkward D/C pt from being on FHR monitor. States baby is very active and shows no signs of distress. States Dr Roselie Awkward wants urinalysis, sterile spec exam to check for fluid, blood, dilation, CBC, type and screen, referral to family practice OB for followup.

## 2019-08-11 NOTE — Progress Notes (Signed)
Notified of this 23 yo G2P1 @ 24.[redacted] wks GA presenting to ED with report of cramping, one drop of blood this AM and decreased fetal movement earlier today.  Patient denies current vaginal bleeding, LOF from vagina and now reports active fetal movement.  Pt received some care in [place], then moved away from area and had no further care. FHR Category II with occasional variable decels appropriate for GA. No UC's tracing, occasional UI noted. Dr. Roselie Awkward of [facility] notified of pt in ED and of above. He requests UA, CBC, Type and Screen, SSE and that pt be advised of resources to return to prenatal care. Advised ED RN of above and requested EFM removal.

## 2019-08-12 LAB — GC/CHLAMYDIA PROBE AMP (~~LOC~~) NOT AT ARMC
Chlamydia: POSITIVE — AB
Neisseria Gonorrhea: NEGATIVE

## 2019-08-12 LAB — HIV ANTIBODY (ROUTINE TESTING W REFLEX): HIV Screen 4th Generation wRfx: NONREACTIVE

## 2019-08-12 LAB — RPR: RPR Ser Ql: NONREACTIVE

## 2019-08-20 ENCOUNTER — Telehealth: Payer: Self-pay

## 2019-08-20 NOTE — Telephone Encounter (Signed)
Attempted to call client regarding ?prenatal care site; seen at [facility] and now has [place] address; no answer Debera Lat, RN

## 2019-08-23 NOTE — Telephone Encounter (Signed)
TC to patient at home number and at cell number. Both outgoing messages state that number is not accepting calls at this time. Unable to LM.Jenetta Downer, RN

## 2019-09-03 NOTE — Telephone Encounter (Signed)
moved to [place] and was inquiring if records have been faxed over to new doctor per request

## 2019-11-07 ENCOUNTER — Inpatient Hospital Stay (HOSPITAL_COMMUNITY): Payer: Medicaid Other | Admitting: Anesthesiology

## 2019-11-07 ENCOUNTER — Other Ambulatory Visit: Payer: Self-pay

## 2019-11-07 ENCOUNTER — Encounter (HOSPITAL_COMMUNITY): Payer: Self-pay

## 2019-11-07 ENCOUNTER — Inpatient Hospital Stay (HOSPITAL_COMMUNITY)
Admission: AD | Admit: 2019-11-07 | Discharge: 2019-11-09 | DRG: 807 | Disposition: A | Discharge status: Home / Self Care | Payer: Medicaid Other | Attending: Obstetrics & Gynecology | Admitting: Obstetrics & Gynecology

## 2019-11-07 DIAGNOSIS — O4202 Full-term premature rupture of membranes, onset of labor within 24 hours of rupture: Secondary | ICD-10-CM | POA: Diagnosis not present

## 2019-11-07 DIAGNOSIS — O9902 Anemia complicating childbirth: Secondary | ICD-10-CM | POA: Diagnosis present

## 2019-11-07 DIAGNOSIS — Z87891 Personal history of nicotine dependence: Secondary | ICD-10-CM | POA: Diagnosis not present

## 2019-11-07 DIAGNOSIS — D649 Anemia, unspecified: Secondary | ICD-10-CM | POA: Diagnosis present

## 2019-11-07 DIAGNOSIS — O4292 Full-term premature rupture of membranes, unspecified as to length of time between rupture and onset of labor: Secondary | ICD-10-CM | POA: Diagnosis present

## 2019-11-07 DIAGNOSIS — O99824 Streptococcus B carrier state complicating childbirth: Secondary | ICD-10-CM | POA: Diagnosis present

## 2019-11-07 DIAGNOSIS — H9192 Unspecified hearing loss, left ear: Secondary | ICD-10-CM | POA: Diagnosis present

## 2019-11-07 DIAGNOSIS — O99012 Anemia complicating pregnancy, second trimester: Secondary | ICD-10-CM

## 2019-11-07 DIAGNOSIS — O26893 Other specified pregnancy related conditions, third trimester: Secondary | ICD-10-CM | POA: Diagnosis present

## 2019-11-07 DIAGNOSIS — Z20828 Contact with and (suspected) exposure to other viral communicable diseases: Secondary | ICD-10-CM | POA: Diagnosis present

## 2019-11-07 DIAGNOSIS — Z3480 Encounter for supervision of other normal pregnancy, unspecified trimester: Secondary | ICD-10-CM

## 2019-11-07 DIAGNOSIS — Z3A37 37 weeks gestation of pregnancy: Secondary | ICD-10-CM | POA: Diagnosis not present

## 2019-11-07 DIAGNOSIS — O99820 Streptococcus B carrier state complicating pregnancy: Secondary | ICD-10-CM

## 2019-11-07 HISTORY — DX: Other specified health status: Z78.9

## 2019-11-07 LAB — ABO/RH: ABO/RH(D): B POS

## 2019-11-07 LAB — CBC
HCT: 32 % — ABNORMAL LOW (ref 36.0–46.0)
Hemoglobin: 10.3 g/dL — ABNORMAL LOW (ref 12.0–15.0)
MCH: 28.3 pg (ref 26.0–34.0)
MCHC: 32.2 g/dL (ref 30.0–36.0)
MCV: 87.9 fL (ref 80.0–100.0)
Platelets: 273 10*3/uL (ref 150–400)
RBC: 3.64 MIL/uL — ABNORMAL LOW (ref 3.87–5.11)
RDW: 12.5 % (ref 11.5–15.5)
WBC: 10.8 10*3/uL — ABNORMAL HIGH (ref 4.0–10.5)
nRBC: 0 % (ref 0.0–0.2)

## 2019-11-07 LAB — SARS CORONAVIRUS 2 (TAT 6-24 HRS): SARS Coronavirus 2: NEGATIVE

## 2019-11-07 LAB — TYPE AND SCREEN
ABO/RH(D): B POS
Antibody Screen: NEGATIVE

## 2019-11-07 LAB — POCT FERN TEST: POCT Fern Test: POSITIVE

## 2019-11-07 MED ORDER — ONDANSETRON HCL 4 MG/2ML IJ SOLN: 4.0000 mg | Freq: Four times a day (QID) | INTRAMUSCULAR | Status: DC | PRN

## 2019-11-07 MED ORDER — PENICILLIN G POT IN DEXTROSE 60000 UNIT/ML IV SOLN
3.0000 10*6.[IU] | INTRAVENOUS | Status: DC
  Administered 2019-11-07 – 2019-11-08 (×2): 3 10*6.[IU] via INTRAVENOUS
  Filled 2019-11-07 (×2): qty 50

## 2019-11-07 MED ORDER — SODIUM CHLORIDE 0.9 % IV SOLN
5.0000 10*6.[IU] | Freq: Once | INTRAVENOUS | Status: AC
  Administered 2019-11-07: 5 10*6.[IU] via INTRAVENOUS
  Filled 2019-11-07: qty 5

## 2019-11-07 MED ORDER — OXYCODONE-ACETAMINOPHEN 5-325 MG PO TABS: 1.0000 | ORAL_TABLET | ORAL | Status: DC | PRN

## 2019-11-07 MED ORDER — EPHEDRINE 5 MG/ML INJ: 10.0000 mg | INTRAVENOUS | Status: DC | PRN

## 2019-11-07 MED ORDER — OXYTOCIN 40 UNITS IN NORMAL SALINE INFUSION - SIMPLE MED
1.0000 m[IU]/min | INTRAVENOUS | Status: DC
  Administered 2019-11-08: 2 m[IU]/min via INTRAVENOUS

## 2019-11-07 MED ORDER — OXYTOCIN 40 UNITS IN NORMAL SALINE INFUSION - SIMPLE MED
2.5000 [IU]/h | INTRAVENOUS | Status: DC
  Filled 2019-11-07: qty 1000

## 2019-11-07 MED ORDER — TERBUTALINE SULFATE 1 MG/ML IJ SOLN: 0.2500 mg | Freq: Once | INTRAMUSCULAR | Status: DC | PRN

## 2019-11-07 MED ORDER — ACETAMINOPHEN 325 MG PO TABS: 650.0000 mg | ORAL_TABLET | ORAL | Status: DC | PRN

## 2019-11-07 MED ORDER — LIDOCAINE HCL (PF) 1 % IJ SOLN: 30.0000 mL | INTRAMUSCULAR | Status: DC | PRN

## 2019-11-07 MED ORDER — MISOPROSTOL 50MCG HALF TABLET
50.0000 ug | ORAL_TABLET | ORAL | Status: DC | PRN
  Administered 2019-11-07: 50 ug via ORAL
  Filled 2019-11-07: qty 1

## 2019-11-07 MED ORDER — SOD CITRATE-CITRIC ACID 500-334 MG/5ML PO SOLN: 30.0000 mL | ORAL | Status: DC | PRN

## 2019-11-07 MED ORDER — FENTANYL-BUPIVACAINE-NACL 0.5-0.125-0.9 MG/250ML-% EP SOLN
12.0000 mL/h | EPIDURAL | Status: DC | PRN
  Filled 2019-11-07: qty 250

## 2019-11-07 MED ORDER — OXYTOCIN BOLUS FROM INFUSION
500.0000 mL | Freq: Once | INTRAVENOUS | Status: AC
  Administered 2019-11-08: 500 mL via INTRAVENOUS

## 2019-11-07 MED ORDER — LACTATED RINGERS IV SOLN: 500.0000 mL | Freq: Once | INTRAVENOUS | Status: DC

## 2019-11-07 MED ORDER — LIDOCAINE HCL (PF) 1 % IJ SOLN
INTRAMUSCULAR | Status: DC | PRN
  Administered 2019-11-07 (×2): 5 mL via EPIDURAL

## 2019-11-07 MED ORDER — OXYCODONE-ACETAMINOPHEN 5-325 MG PO TABS: 2.0000 | ORAL_TABLET | ORAL | Status: DC | PRN

## 2019-11-07 MED ORDER — PHENYLEPHRINE 40 MCG/ML (10ML) SYRINGE FOR IV PUSH (FOR BLOOD PRESSURE SUPPORT): 80.0000 ug | PREFILLED_SYRINGE | INTRAVENOUS | Status: DC | PRN

## 2019-11-07 MED ORDER — FENTANYL CITRATE (PF) 100 MCG/2ML IJ SOLN
50.0000 ug | INTRAMUSCULAR | Status: DC | PRN
  Administered 2019-11-07: 50 ug via INTRAVENOUS
  Filled 2019-11-07: qty 2

## 2019-11-07 MED ORDER — SODIUM CHLORIDE (PF) 0.9 % IJ SOLN
INTRAMUSCULAR | Status: DC | PRN
  Administered 2019-11-07: 12 mL/h via EPIDURAL

## 2019-11-07 MED ORDER — LACTATED RINGERS IV SOLN
INTRAVENOUS | Status: DC
  Administered 2019-11-07 (×2): via INTRAVENOUS

## 2019-11-07 MED ORDER — LACTATED RINGERS IV SOLN: 500.0000 mL | INTRAVENOUS | Status: DC | PRN

## 2019-11-07 MED ORDER — DIPHENHYDRAMINE HCL 50 MG/ML IJ SOLN: 12.5000 mg | INTRAMUSCULAR | Status: DC | PRN

## 2019-11-07 NOTE — Anesthesia Procedure Notes (Signed)
Epidural Patient location during procedure: OB Start time: 11/07/2019 8:31 PM End time: 11/07/2019 8:42 PM  Staffing Anesthesiologist: Lidia Collum, MD Performed: anesthesiologist   Preanesthetic Checklist Completed: patient identified, pre-op evaluation, timeout performed, IV checked, risks and benefits discussed and monitors and equipment checked  Epidural Patient position: sitting Prep: DuraPrep Patient monitoring: heart rate, continuous pulse ox and blood pressure Approach: midline Location: L3-L4 Injection technique: LOR air  Needle:  Needle type: Tuohy  Needle gauge: 17 G Needle length: 9 cm Needle insertion depth: 6 cm Catheter type: closed end flexible Catheter size: 19 Gauge Catheter at skin depth: 11 cm Test dose: negative  Assessment Events: blood not aspirated, injection not painful, no injection resistance, negative IV test and no paresthesia  Additional Notes Reason for block:procedure for pain

## 2019-11-07 NOTE — MAU Note (Signed)
Karen [name] is a 23 y.o. at [redacted]w[redacted]d here in MAU reporting: about 30 minutes ago she was using the bathroom and wiped and saw some blood. States she had a small gush of fluid and then a large gush and still feels leaking. It is clear. No contractions. +FM  Onset of complaint: today  Pain score: 0/10  Vitals:   11/07/19 1337  BP: (!) 116/52  Pulse: (!) 107  Resp: 17  Temp: 98.5 F (36.9 C)  SpO2: 100%     FHT: +FM, 152  Lab orders placed from triage: none

## 2019-11-07 NOTE — MAU Note (Signed)
Vertex presentation confirmed by bedside US, by cnm veronica rogers

## 2019-11-07 NOTE — H&P (Addendum)
LABOR AND DELIVERY ADMISSION HISTORY AND PHYSICAL NOTE  Karen [name] is a 23 y.o. female G2P1001 with IUP at [redacted]w[redacted]d by LMP presenting for IOL after PROM.   Patient reports that she is doing well.  She says that although she started her prenatal care and [place] [facility] she has gotten the rest of her prenatal care down in [place].  She is moving back to the area.  She reports that earlier today she was using the restroom and noticed a small gush of fluid followed by a large gush of fluid.  She was evaluated in the MAU and found to have ruptured membranes.  She denies any issues with this pregnancy.  She says that she did not have her glucose test but has not had any issues with blood pressures.  This is her second pregnancy and she denies any issues with her first pregnancy.  Her first child was born at 9 weeks and weighed 6 pounds.  She reports she labor for 8 hours.  Denies headaches, change in vision, chest pain, shortness of breath, change in urination.   Prenatal History/Complications: -insufficient PNC  Past Medical History: Past Medical History:  Diagnosis Date  . 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 Medical history non-contributory    deafness in left ear    Past Surgical History: Past Surgical History:  Procedure Laterality Date  . NO PAST SURGERIES      Obstetrical History: OB History    Gravida  2   Para  1   Term  1   Preterm  0   AB  0   Living  1     SAB  0   TAB  0   Ectopic  0   Multiple      Live Births  1           Social History: Social History   Socioeconomic History  . Marital status: Single    Spouse name: Not on file  . Number of children: Not on file  . Years of education: Not on file  . Highest education level: Not on file  Occupational History  . Not on file  Social Needs  . Financial resource strain: Not on file  . Food insecurity    Worry: Not on file     Inability: Not on file  . Transportation needs    Medical: Not on file    Non-medical: Not on file  Tobacco Use  . Smoking status: Former Smoker    Types: Cigars    Quit date: 03/31/2019    Years since quitting: 0.6  . Smokeless tobacco: Never Used  Substance and Sexual Activity  . Alcohol use: Not Currently    Frequency: Never    Comment: last time was in early April 2020  . Drug use: Never  . Sexual activity: Yes    Birth control/protection: None  Lifestyle  . Physical activity    Days per week: Not on file    Minutes per session: Not on file  . Stress: Not on file  Relationships  . Social Musician on phone: Not on file    Gets together: Not on file    Attends religious service: Not on file    Active member of club or organization: Not on file    Attends meetings of clubs or organizations: Not on file    Relationship status: Not  on file  Other Topics Concern  . Not on file  Social History Narrative   ** Merged History Encounter **        Family History: History reviewed. No pertinent family history.  Allergies: No Known Allergies  Medications Prior to Admission  Medication Sig Dispense Refill Last Dose  . acetaminophen (TYLENOL) 500 MG tablet Take 500 mg by mouth every 6 (six) hours as needed for headache (pain).       Review of Systems   All systems reviewed and negative except as stated in HPI  Blood pressure 121/89, pulse 93, temperature 97.8 F (36.6 C), temperature source Axillary, resp. rate 16, last menstrual period 02/19/2019, SpO2 100 %. General appearance: alert, cooperative and no distress Lungs: clear to auscultation bilaterally Heart: regular rate and rhythm Abdomen: soft, non-tender; bowel sounds normal Extremities: No calf swelling or tenderness Presentation: cephalic by ultrasound and MAU and by sutures Fetal monitoring: 135 bpm, +accels, no decels Uterine activity: Irregular and difficult to trace.    Prenatal labs: ABO, Rh:  --/--/B POS Performed at [facility], [address], [place], [place] [address] , B POS (08/12 1248) Antibody: NEG (08/12 1248) Rubella:  unknown RPR: Non Reactive (08/12 1248)  HBsAg: Negative (06/13 0000)  HIV: Non Reactive (08/12 1248)  GBS:   Positive in urine during second trimester.  Will treat with penicillin 1 hr Glucola: No screening Genetic screening: Negative Anatomy US: Normal  Prenatal Transfer Tool  Maternal Diabetes: No testing Genetic Screening: Normal Maternal Ultrasounds/Referrals: Normal Fetal Ultrasounds or other Referrals:  None Maternal Substance Abuse:  No Significant Maternal Medications:  None Significant Maternal Lab Results: Group B Strep positive  Results for orders placed or performed during the hospital encounter of 11/07/19 (from the past 24 hour(s))  Fern Test   Collection Time: 11/07/19  2:23 PM  Result Value Ref Range   POCT Fern Test Positive = ruptured amniotic membanes     Patient Active Problem List   Diagnosis Date Noted  . Normal labor 11/07/2019  . Encounter for supervision of normal pregnancy in multigravida, antepartum 07/14/2019  . 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 Hearing loss in left ear 06/11/2019  . Anemia complicating pregnancy in second trimester 06/11/2019    Assessment: Karen [name] is a 23 y.o. G2P1001 at [redacted]w[redacted]d here for IOL after PROM at approximately 1300 (11/8).   #Labor: On initial exam cervix was 1/2 cm in 25% effaced.  Will initiate induction with Cytotec.  Foley bulb also placed #Pain: Epidural upon request #FWB: Category 1 reassuring #ID:  GBS colonized, treated with penicillin #MOF: Bottle #MOC: Undecided between Depo and Nexplanon #Circ:  Requesting further information  Gifford Shave 11/07/2019, 3:32 PM  Midwife attestation: I have seen and examined this patient; I agree with above documentation in the resident's note.   PE: Gen: calm comfortable, NAD Resp:  normal effort and rate Abd: gravid  ROS, labs, PMH reviewed  Assessment/Plan: Karen [name] is a 23 y.o. G2P1001 here for PROM at term Admit to LD Labor: latent FWB: Cat I GBS pos Admit to LD Cervical ripening with FB and Cytotec Anticipate SVD  Julianne Handler, CNM  11/07/2019, 5:22 PM

## 2019-11-07 NOTE — Progress Notes (Signed)
Subjective: Pt resting comfortably with epidural.   Objective: BP 119/84   Pulse 97   Temp 97.8 F (36.6 C) (Oral)   Resp 17   Ht 5\' 2"  (1.575 m)   Wt 87.8 kg   LMP 02/19/2019   SpO2 100%   BMI 35.41 kg/m   Fetal Well-Being: -- FHR 130, moderate variability, +accels, no decels  Toco: -- q 6 minutes  Dilation: 6 Effacement (%): 80, 90 Station: -1 Presentation: Vertex Exam by:: Maryagnes Amos  Assessment and Plan: 23 y.o. G2P1001 [redacted]w[redacted]d admitted for spontaneous rupture of membranes.   Labor:  -- Contractions have spaced out with small amount of cervical change. Will augment with low dose Pitocin. -- GBS positive. PCN given -- Pain control: epidural placed   Maryagnes Amos, SNM 11:57 PM

## 2019-11-07 NOTE — Progress Notes (Addendum)
Subjective: In to introduce self to patient. Pt very uncomfortable with contractions. Feels contractions every 74mins. RN setting patient up for epidural.   Objective: BP 102/62   Pulse 93   Temp 98.3 F (36.8 C) (Oral)   Resp 17   Ht 5\' 2"  (1.575 m)   Wt 87.8 kg   LMP 02/19/2019   SpO2 100%   BMI 35.41 kg/m   Fetal Well-Being:  -- FHR 135, moderate variability, +accels, no decels  Toco:  -- q 59minutes  Dilation: 2 Effacement (%): 20 Station: -3 Presentation: Vertex Exam by:: crescenzo  Assessment and Plan: 23 y.o. G2P1001 [redacted]w[redacted]d admitted for spontaneous rupture of membranes.   Labor:  -- S/p foley bulb -- Pt contracting adequately. Will expectantly manage for now. If labor stalls, may start Pitocin. -- GBS positive. Given PCN.  -- Pain control: planning epidural   Karen [name], SNM 8:39 PM

## 2019-11-07 NOTE — Anesthesia Preprocedure Evaluation (Signed)
Anesthesia Evaluation  Patient identified by MRN, date of birth, ID band Patient awake    Reviewed: Allergy & Precautions, H&P , NPO status , Patient's Chart, lab work & pertinent test results  History of Anesthesia Complications Negative for: history of anesthetic complications  Airway Mallampati: II  TM Distance: >3 FB Neck ROM: full    Dental no notable dental hx.    Pulmonary neg pulmonary ROS, former smoker,    Pulmonary exam normal        Cardiovascular negative cardio ROS Normal cardiovascular exam Rhythm:regular Rate:Normal     Neuro/Psych negative neurological ROS  negative psych ROS   GI/Hepatic negative GI ROS, Neg liver ROS,   Endo/Other  negative endocrine ROS  Renal/GU negative Renal ROS  negative genitourinary   Musculoskeletal   Abdominal   Peds  Hematology  (+) Blood dyscrasia, anemia ,   Anesthesia Other Findings   Reproductive/Obstetrics (+) Pregnancy                             Anesthesia Physical Anesthesia Plan  ASA: II  Anesthesia Plan: Epidural   Post-op Pain Management:    Induction:   PONV Risk Score and Plan:   Airway Management Planned:   Additional Equipment:   Intra-op Plan:   Post-operative Plan:   Informed Consent: I have reviewed the patients History and Physical, chart, labs and discussed the procedure including the risks, benefits and alternatives for the proposed anesthesia with the patient or authorized representative who has indicated his/her understanding and acceptance.       Plan Discussed with:   Anesthesia Plan Comments:         Anesthesia Quick Evaluation  

## 2019-11-08 ENCOUNTER — Encounter (HOSPITAL_COMMUNITY): Payer: Self-pay

## 2019-11-08 DIAGNOSIS — O99824 Streptococcus B carrier state complicating childbirth: Secondary | ICD-10-CM

## 2019-11-08 DIAGNOSIS — Z3A37 37 weeks gestation of pregnancy: Secondary | ICD-10-CM

## 2019-11-08 DIAGNOSIS — O4202 Full-term premature rupture of membranes, onset of labor within 24 hours of rupture: Secondary | ICD-10-CM

## 2019-11-08 LAB — RPR: RPR Ser Ql: NONREACTIVE

## 2019-11-08 LAB — RUBELLA SCREEN: Rubella: 1.56 index (ref >0.99)

## 2019-11-08 MED ORDER — ONDANSETRON HCL 4 MG/2ML IJ SOLN: 4.0000 mg | INTRAMUSCULAR | Status: DC | PRN

## 2019-11-08 MED ORDER — ONDANSETRON HCL 4 MG PO TABS: 4.0000 mg | ORAL_TABLET | ORAL | Status: DC | PRN

## 2019-11-08 MED ORDER — SENNOSIDES-DOCUSATE SODIUM 8.6-50 MG PO TABS
2.0000 | ORAL_TABLET | ORAL | Status: DC
  Administered 2019-11-08: 2 via ORAL
  Filled 2019-11-08: qty 2

## 2019-11-08 MED ORDER — COCONUT OIL OIL: 1.0000 "application " | TOPICAL_OIL | Status: DC | PRN

## 2019-11-08 MED ORDER — IBUPROFEN 600 MG PO TABS
600.0000 mg | ORAL_TABLET | Freq: Four times a day (QID) | ORAL | Status: DC
  Filled 2019-11-08: qty 1

## 2019-11-08 MED ORDER — BENZOCAINE-MENTHOL 20-0.5 % EX AERO: 1.0000 "application " | INHALATION_SPRAY | CUTANEOUS | Status: DC | PRN

## 2019-11-08 MED ORDER — COMPLETENATE 29-1 MG PO CHEW
1.0000 | CHEWABLE_TABLET | Freq: Every day | ORAL | Status: DC
  Administered 2019-11-08: 1 via ORAL
  Filled 2019-11-08: qty 1

## 2019-11-08 MED ORDER — ZOLPIDEM TARTRATE 5 MG PO TABS: 5.0000 mg | ORAL_TABLET | Freq: Every evening | ORAL | Status: DC | PRN

## 2019-11-08 MED ORDER — DIPHENHYDRAMINE HCL 25 MG PO CAPS: 25.0000 mg | ORAL_CAPSULE | Freq: Four times a day (QID) | ORAL | Status: DC | PRN

## 2019-11-08 MED ORDER — DIBUCAINE (PERIANAL) 1 % EX OINT: 1.0000 "application " | TOPICAL_OINTMENT | CUTANEOUS | Status: DC | PRN

## 2019-11-08 MED ORDER — IBUPROFEN 100 MG/5ML PO SUSP
600.0000 mg | Freq: Four times a day (QID) | ORAL | Status: DC
  Administered 2019-11-08 – 2019-11-09 (×5): 600 mg via ORAL
  Filled 2019-11-08 (×5): qty 30

## 2019-11-08 MED ORDER — ACETAMINOPHEN 325 MG PO TABS: 650.0000 mg | ORAL_TABLET | ORAL | Status: DC | PRN

## 2019-11-08 MED ORDER — OXYCODONE HCL 5 MG PO TABS: 5.0000 mg | ORAL_TABLET | ORAL | Status: DC | PRN

## 2019-11-08 MED ORDER — PRENATAL MULTIVITAMIN CH
1.0000 | ORAL_TABLET | Freq: Every day | ORAL | Status: DC
  Filled 2019-11-08 (×2): qty 1

## 2019-11-08 MED ORDER — SIMETHICONE 80 MG PO CHEW: 80.0000 mg | CHEWABLE_TABLET | ORAL | Status: DC | PRN

## 2019-11-08 MED ORDER — OXYCODONE HCL 5 MG PO TABS: 10.0000 mg | ORAL_TABLET | ORAL | Status: DC | PRN

## 2019-11-08 MED ORDER — WITCH HAZEL-GLYCERIN EX PADS: 1.0000 "application " | MEDICATED_PAD | CUTANEOUS | Status: DC | PRN

## 2019-11-08 NOTE — Discharge Summary (Signed)
Postpartum Discharge Summary  Date of Service updated-yes     Patient Name: Karen [name] DOB: [birth year] MRN: [id number]  Date of admission: 11/07/2019 Delivering Provider: [name]   Date of discharge: 11/09/2019  Admitting diagnosis: [place]  Intrauterine pregnancy: [redacted]w[redacted]d    Secondary diagnosis:  Active Problems:   Normal labor  Additional problems: n/a     Discharge diagnosis: Term Pregnancy Delivered                                                                                                Post partum procedures:none  Augmentation: Pitocin, Cytotec and Foley Balloon  Complications: None  Hospital course:  Onset of Labor With Vaginal Delivery     23y.o. yo G2P1001 at 357w3das admitted in Latent Labor on 11/07/2019. Patient had an uncomplicated labor course as follows: FB with cytotec and pitocin, progressed rapidly to complete and pushed one time to deliver Membrane Rupture Time/Date: 1:00 PM ,11/07/2019   Intrapartum Procedures: Episiotomy: None [1]                                         Lacerations:  None [1]  Patient had a delivery of a Viable infant. 11/08/2019  Information for the patient's newborn:  VaLaryah, Neuser0[[id number]]Delivery Method: Vaginal, Spontaneous(Filed from Delivery Summary)     Pateint had an uncomplicated postpartum course.  She is ambulating, tolerating a regular diet, passing flatus, and urinating well. Patient is discharged home in stable condition on 11/09/19.  Delivery time: 2:39 AM    Magnesium Sulfate received: No BMZ received: No Rhophylac:N/A MMR:N/A Transfusion:No  Physical exam  Vitals:   11/08/19 1330 11/08/19 1743 11/08/19 2046 11/09/19 0520  BP: 111/69 106/72 113/69 113/60  Pulse: 99 95 90 86  Resp: '16 18 18 18  '$ Temp: 97.8 F (36.6 C) (!) 97.5 F (36.4 C) (!) 97.4 F (36.3 C) 98 F (36.7 C)  TempSrc: Oral Oral Oral   SpO2: 100% 99% 100% 99%  Weight:      Height:       General:  alert, cooperative and no distress Lochia: appropriate Uterine Fundus: firm Incision: N/A DVT Evaluation: No evidence of DVT seen on physical exam. Negative Homan's sign. No cords or calf tenderness. No significant calf/ankle edema. Labs: Lab Results  Component Value Date   WBC 10.8 (H) 11/07/2019   HGB 10.3 (L) 11/07/2019   HCT 32.0 (L) 11/07/2019   MCV 87.9 11/07/2019   PLT 273 11/07/2019   CMP Latest Ref Rng & Units 10/07/2017  Glucose 65 - 99 mg/dL 98  BUN 6 - 20 mg/dL 8  Creatinine 0.44 - 1.00 mg/dL 0.79  Sodium 135 - 145 mmol/L 136  Potassium 3.5 - 5.1 mmol/L 3.5  Chloride 101 - 111 mmol/L 106  CO2 22 - 32 mmol/L 22  Calcium 8.9 - 10.3 mg/dL 9.3  Total Protein 6.5 - 8.1 g/dL 7.1  Total Bilirubin 0.3 - 1.2 mg/dL 0.4  Alkaline Phos 38 - 126 U/L 69  AST 15 - 41 U/L 19  ALT 14 - 54 U/L 14    Discharge instruction: per After Visit Summary and "Baby and Me Booklet".  After visit meds:  Allergies as of 11/09/2019   No Known Allergies     Medication List    TAKE these medications   acetaminophen 500 MG tablet Commonly known as: TYLENOL Take 500 mg by mouth every 6 (six) hours as needed for headache (pain).   calcium carbonate 500 MG chewable tablet Commonly known as: TUMS - dosed in mg elemental calcium Chew 24 tablets by mouth as needed for indigestion or heartburn.   ibuprofen 100 MG/5ML suspension Commonly known as: ADVIL Take 30 mLs (600 mg total) by mouth every 6 (six) hours as needed for mild pain or moderate pain.   Prenatal 28-0.8 MG Tabs Take 1 tablet by mouth daily.       Diet: routine diet  Activity: Advance as tolerated. Pelvic rest for 6 weeks.   Outpatient follow up:4 weeks Follow up Appt: Future Appointments  Date Time Provider [place]  12/06/2019 10:35 AM [place]-Kirby, Kathie Dike, CNM [place] None   Follow up Visit: [place] [facility]. Go in 4 week(s).   Specialty: Obstetrics  and Gynecology Contact information: [address], [place] [id number] [place] [id number] [phone]           Please schedule this patient for Postpartum visit in: 4 weeks with the following provider: Any provider For C/S patients schedule nurse incision check in weeks 2 weeks: no Low risk pregnancy complicated by: n/a Delivery mode:  SVD Anticipated Birth Control:  Depo PP Procedures needed: n/a  Schedule Integrated BH visit: no  Newborn Data: Live born female  Birth Weight: 40'1  APGAR: 48, 9  Newborn Delivery   Birth date/time: 11/08/2019 02:39:00 Delivery type: Vaginal, Spontaneous      Baby Feeding: Bottle Disposition:home with mother

## 2019-11-08 NOTE — Plan of Care (Signed)
completed

## 2019-11-08 NOTE — Anesthesia Postprocedure Evaluation (Signed)
Anesthesia Post Note  Patient: Karen [name]  Procedure(s) Performed: AN AD [place]     Patient location during evaluation: Mother Baby Anesthesia Type: Epidural Level of consciousness: awake and alert Pain management: pain level controlled Vital Signs Assessment: post-procedure vital signs reviewed and stable Respiratory status: spontaneous breathing, nonlabored ventilation and respiratory function stable Cardiovascular status: stable Postop Assessment: no headache, no backache and epidural receding Anesthetic complications: no    Last Vitals:  Vitals:   11/08/19 0500 11/08/19 0909  BP: 118/66 113/60  Pulse: 100 95  Resp: 18 18  Temp: (!) 36.3 C 36.6 C  SpO2: 100% 99%    Last Pain:  Vitals:   11/08/19 0909  TempSrc: Oral  PainSc: 0-No pain   Pain Goal:                Epidural/Spinal Function Cutaneous sensation: Normal sensation (11/08/19 0909), Patient able to flex knees: Yes (11/08/19 0909), Patient able to lift hips off bed: Yes (11/08/19 0909), Back pain beyond tenderness at insertion site: No (11/08/19 0909), Progressively worsening motor and/or sensory loss: No (11/08/19 0909), Bowel and/or bladder incontinence post epidural: No (11/08/19 0909)  Maxx Calaway

## 2019-11-08 NOTE — Lactation Note (Signed)
This note was copied from a baby's chart. Lactation Consultation Note  Patient Name: Boy Jynesis Nakamura PXTGG'Y Date: 11/08/2019 Reason for consult: Initial assessment  LC Initial Visit:  Mother informed me that she is no longer interested in breast feeding.  She wants to formula feed only.  Lactation services are no longer needed.    Maternal Data    Feeding Feeding Type: Bottle Fed - Formula Nipple Type: Slow - flow  LATCH Score                   Interventions    Lactation Tools Discussed/Used     Consult Status Consult Status: Complete    Brittnei Jagiello R Saraya Tirey 11/08/2019, 2:16 PM

## 2019-11-09 MED ORDER — IBUPROFEN 100 MG/5ML PO SUSP: 600.0000 mg | Freq: Four times a day (QID) | ORAL | 0 refills | Status: AC | PRN

## 2019-11-09 MED ORDER — AMLODIPINE BESYLATE 5 MG PO TABS: 5.0000 mg | ORAL_TABLET | Freq: Every day | ORAL | Status: DC

## 2019-11-09 MED ORDER — FERROUS SULFATE 325 (65 FE) MG PO TABS: 325.0000 mg | ORAL_TABLET | Freq: Two times a day (BID) | ORAL | Status: DC

## 2019-11-09 NOTE — Progress Notes (Signed)
Post Partum Day 1 Subjective: Patient doing well this morning. No acute events overnight. Pain well controlled with ibuprofen, bleeding is similar to her menses with some small clots. She denies issues with ambulating or voiding and has had bowel movements.   Objective: Blood pressure 113/60, pulse 86, temperature 98 F (36.7 C), resp. rate 18, height 5\' 2"  (1.575 m), weight 87.8 kg, last menstrual period 02/19/2019, SpO2 99 %, unknown if currently breastfeeding.  Physical Exam:  General: alert, cooperative and no distress Lochia: appropriate Uterine Fundus: firm Incision: n/a DVT Evaluation: No evidence of DVT seen on physical exam. No significant calf/ankle edema.  Recent Labs    11/07/19 1549  HGB 10.3*  HCT 32.0*    Assessment/Plan: Patient is doing well Unsure about birth control, does not want Nexplanon or Depo. Discussed IUD with patient which she will consider at postpartum visit.  Continue current care   LOS: 2 days   Monserat Prestigiacomo A Dillin Lofgren 11/09/2019, 7:32 AM

## 2019-11-09 NOTE — Discharge Instructions (Signed)
Postpartum Care After Vaginal Delivery °This sheet gives you information about how to care for yourself from the time you deliver your baby to up to 6-12 weeks after delivery (postpartum period). Your health care provider may also give you more specific instructions. If you have problems or questions, contact your health care provider. °Follow these instructions at home: °Vaginal bleeding °· It is normal to have vaginal bleeding (lochia) after delivery. Wear a sanitary pad for vaginal bleeding and discharge. °? During the first week after delivery, the amount and appearance of lochia is often similar to a menstrual period. °? Over the next few weeks, it will gradually decrease to a dry, yellow-brown discharge. °? For most women, lochia stops completely by 4-6 weeks after delivery. Vaginal bleeding can vary from woman to woman. °· Change your sanitary pads frequently. Watch for any changes in your flow, such as: °? A sudden increase in volume. °? A change in color. °? Large blood clots. °· If you pass a blood clot from your vagina, save it and call your health care provider to discuss. Do not flush blood clots down the toilet before talking with your health care provider. °· Do not use tampons or douches until your health care provider says this is safe. °· If you are not breastfeeding, your period should return 6-8 weeks after delivery. If you are feeding your child breast milk only (exclusive breastfeeding), your period may not return until you stop breastfeeding. °Perineal care °· Keep the area between the vagina and the anus (perineum) clean and dry as told by your health care provider. Use medicated pads and pain-relieving sprays and creams as directed. °· If you had a cut in the perineum (episiotomy) or a tear in the vagina, check the area for signs of infection until you are healed. Check for: °? More redness, swelling, or pain. °? Fluid or blood coming from the cut or tear. °? Warmth. °? Pus or a bad  smell. °· You may be given a squirt bottle to use instead of wiping to clean the perineum area after you go to the bathroom. As you start healing, you may use the squirt bottle before wiping yourself. Make sure to wipe gently. °· To relieve pain caused by an episiotomy, a tear in the vagina, or swollen veins in the anus (hemorrhoids), try taking a warm sitz bath 2-3 times a day. A sitz bath is a warm water bath that is taken while you are sitting down. The water should only come up to your hips and should cover your buttocks. °Breast care °· Within the first few days after delivery, your breasts may feel heavy, full, and uncomfortable (breast engorgement). Milk may also leak from your breasts. Your health care provider can suggest ways to help relieve the discomfort. Breast engorgement should go away within a few days. °· If you are breastfeeding: °? Wear a bra that supports your breasts and fits you well. °? Keep your nipples clean and dry. Apply creams and ointments as told by your health care provider. °? You may need to use breast pads to absorb milk that leaks from your breasts. °? You may have uterine contractions every time you breastfeed for up to several weeks after delivery. Uterine contractions help your uterus return to its normal size. °? If you have any problems with breastfeeding, work with your health care provider or lactation consultant. °· If you are not breastfeeding: °? Avoid touching your breasts a lot. Doing this can make   your breasts produce more milk. °? Wear a good-fitting bra and use cold packs to help with swelling. °? Do not squeeze out (express) milk. This causes you to make more milk. °Intimacy and sexuality °· Ask your health care provider when you can engage in sexual activity. This may depend on: °? Your risk of infection. °? How fast you are healing. °? Your comfort and desire to engage in sexual activity. °· You are able to get pregnant after delivery, even if you have not had  your period. If desired, talk with your health care provider about methods of birth control (contraception). °Medicines °· Take over-the-counter and prescription medicines only as told by your health care provider. °· If you were prescribed an antibiotic medicine, take it as told by your health care provider. Do not stop taking the antibiotic even if you start to feel better. °Activity °· Gradually return to your normal activities as told by your health care provider. Ask your health care provider what activities are safe for you. °· Rest as much as possible. Try to rest or take a nap while your baby is sleeping. °Eating and drinking ° °· Drink enough fluid to keep your urine pale yellow. °· Eat high-fiber foods every day. These may help prevent or relieve constipation. High-fiber foods include: °? Whole grain cereals and breads. °? Brown rice. °? Beans. °? Fresh fruits and vegetables. °· Do not try to lose weight quickly by cutting back on calories. °· Take your prenatal vitamins until your postpartum checkup or until your health care provider tells you it is okay to stop. °Lifestyle °· Do not use any products that contain nicotine or tobacco, such as cigarettes and e-cigarettes. If you need help quitting, ask your health care provider. °· Do not drink alcohol, especially if you are breastfeeding. °General instructions °· Keep all follow-up visits for you and your baby as told by your health care provider. Most women visit their health care provider for a postpartum checkup within the first 3-6 weeks after delivery. °Contact a health care provider if: °· You feel unable to cope with the changes that your child brings to your life, and these feelings do not go away. °· You feel unusually sad or worried. °· Your breasts become red, painful, or hard. °· You have a fever. °· You have trouble holding urine or keeping urine from leaking. °· You have little or no interest in activities you used to enjoy. °· You have not  breastfed at all and you have not had a menstrual period for 12 weeks after delivery. °· You have stopped breastfeeding and you have not had a menstrual period for 12 weeks after you stopped breastfeeding. °· You have questions about caring for yourself or your baby. °· You pass a blood clot from your vagina. °Get help right away if: °· You have chest pain. °· You have difficulty breathing. °· You have sudden, severe leg pain. °· You have severe pain or cramping in your lower abdomen. °· You bleed from your vagina so much that you fill more than one sanitary pad in one hour. Bleeding should not be heavier than your heaviest period. °· You develop a severe headache. °· You faint. °· You have blurred vision or spots in your vision. °· You have bad-smelling vaginal discharge. °· You have thoughts about hurting yourself or your baby. °If you ever feel like you may hurt yourself or others, or have thoughts about taking your own life, get help   right away. You can go to the nearest emergency department or call: °· Your local emergency services (911 in the [place] °· A suicide crisis helpline, such as the National Suicide Prevention Lifeline at 1-800-273-8255. This is open 24 hours a day. °Summary °· The period of time right after you deliver your newborn up to 6-12 weeks after delivery is called the postpartum period. °· Gradually return to your normal activities as told by your health care provider. °· Keep all follow-up visits for you and your baby as told by your health care provider. °This information is not intended to replace advice given to you by your health care provider. Make sure you discuss any questions you have with your health care provider. °Document Released: 10/13/2007 Document Revised: 12/19/2017 Document Reviewed: 09/29/2017 °Elsevier Patient Education © 2020 [employer] ° ° °Places to have your son circumcised:                                                                      °[facility]            [phone]   $480 by 4 wks             °[facility]                [phone]   $244 by 4 wks             °[facility]               [phone]   $175 by 2 wks             °[facility]                      [phone]   $250 by 7 days °[facility]                     [phone]   $150 by 4 wks ° °These prices sometimes change but are roughly what you can expect to pay. Please call and confirm pricing.  ° °Circumcision is considered an elective/non-medically necessary procedure. There are many reasons parents decide to have their sons circumsized. During the first year of life circumcised males have a reduced risk of urinary tract infections but after this year the rates between circumcised males and uncircumcised males are the same.  It is safe to have your son circumcised outside of the hospital and the places above perform them regularly.  ° ° °

## 2019-12-06 ENCOUNTER — Ambulatory Visit: Payer: Medicaid Other | Admitting: Advanced Practice Midwife

## 2019-12-09 ENCOUNTER — Encounter: Payer: Self-pay | Admitting: *Deleted

## 2019-12-16 ENCOUNTER — Telehealth: Payer: Self-pay | Admitting: Advanced Practice Midwife

## 2020-02-09 ENCOUNTER — Other Ambulatory Visit: Payer: Self-pay

## 2020-02-09 ENCOUNTER — Encounter: Payer: Self-pay | Admitting: Physician Assistant

## 2020-02-09 ENCOUNTER — Ambulatory Visit: Payer: Medicaid Other | Admitting: Physician Assistant

## 2020-02-09 DIAGNOSIS — B9689 Other specified bacterial agents as the cause of diseases classified elsewhere: Secondary | ICD-10-CM

## 2020-02-09 DIAGNOSIS — Z3009 Encounter for other general counseling and advice on contraception: Secondary | ICD-10-CM

## 2020-02-09 DIAGNOSIS — Z113 Encounter for screening for infections with a predominantly sexual mode of transmission: Secondary | ICD-10-CM

## 2020-02-09 DIAGNOSIS — N760 Acute vaginitis: Secondary | ICD-10-CM | POA: Diagnosis not present

## 2020-02-09 LAB — PREGNANCY, URINE: Preg Test, Ur: NEGATIVE

## 2020-02-09 LAB — WET PREP FOR TRICH, YEAST, CLUE
Trichomonas Exam: NEGATIVE
Yeast Exam: NEGATIVE

## 2020-02-09 MED ORDER — METRONIDAZOLE 250 MG PO TABS: 250.0000 mg | ORAL_TABLET | Freq: Three times a day (TID) | ORAL | 0 refills | Status: DC

## 2020-02-09 NOTE — Progress Notes (Signed)
[facility] Department STI clinic/screening visit  Subjective:  Karen [name] is a 24 y.o. female being seen today for an STI screening visit. The patient reports they do have symptoms.  Patient reports that they do not desire a pregnancy in the next year.   They reported they are interested in discussing contraception today.  No LMP recorded.   Patient has the following medical conditions:   Patient Active Problem List   Diagnosis Date Noted  . Normal labor 11/07/2019  . Encounter for supervision of normal pregnancy in multigravida, antepartum 07/14/2019  . 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 Hearing loss in left ear 06/11/2019  . Anemia complicating pregnancy in second trimester 06/11/2019    Chief Complaint  Patient presents with  . SEXUALLY TRANSMITTED DISEASE    STD screening including bloodwork    HPI  Patient reports that she has had "watery, thick white/yellow discharge" with an odor for 1 week.  Denies other symptoms today.  States that she has thought about starting Depo as BCM and would like some information about this today.  LMP 01/09/2020 and normal.  See flowsheet for further details and programmatic requirements.    The following portions of the patient's history were reviewed and updated as appropriate: allergies, current medications, past medical history, past social history, past surgical history and problem list.  Objective:  There were no vitals filed for this visit.  Physical Exam Constitutional:      General: She is not in acute distress.    Appearance: Normal appearance. She is normal weight.  HENT:     Head: Normocephalic and atraumatic.     Comments: No nits, lice, or hair loss. No cervical, supraclavicular or axillary adenopathy.    Mouth/Throat:     Mouth: Mucous membranes are moist.     Pharynx: Oropharynx is clear. No oropharyngeal exudate or posterior oropharyngeal erythema.  Eyes:   Conjunctiva/sclera: Conjunctivae normal.  Pulmonary:     Effort: Pulmonary effort is normal.  Abdominal:     Palpations: Abdomen is soft. There is no mass.     Tenderness: There is no abdominal tenderness. There is no guarding or rebound.  Genitourinary:    General: Normal vulva.     Rectum: Normal.     Comments: External genitalia/pubic area without nits, lice, edema, erythema, lesions and inguinal adenopathy. Vagina with normal mucosa, moderate amount of thin, white discharge, pH=>4.5. Cervix without visible lesions. Uterus firm, mobile, nt, no masses, no CMT, no adnexal tenderness or fullness. Musculoskeletal:     Cervical back: Neck supple. No tenderness.  Skin:    General: Skin is warm and dry.     Findings: No bruising, erythema, lesion or rash.  Neurological:     Mental Status: She is alert and oriented to person, place, and time.  Psychiatric:        Mood and Affect: Mood normal.        Behavior: Behavior normal.        Thought Content: Thought content normal.        Judgment: Judgment normal.      Assessment and Plan:  Karen [name] is a 24 y.o. female presenting to the [facility] Department for STI screening  1. Screening for STD (sexually transmitted disease) Patient into clinic with symptoms. Rec condoms with all sex. Await test results.  Counseled that RN will call if needs to RTC for further treatment once results are back.  - Pregnancy,  urine - WET PREP FOR TRICH, YEAST, CLUE - Gonococcus culture - Chlamydia/Gonorrhea [facility] - [facility] - Syphilis Serology,  Lab  2. Encounter for counseling regarding contraception Counseled patient re:  BCMs and most extensively about Depo; risks, benefits, and SE. Counseled patient that could get Depo today, use condoms for 2 weeks, repeat OTC pregnancy test in 2 weeks and RTC if positive. Patient opts to receive written info on Depo today and states she will call for an appt if  decides she would like to start Depo.  3. Bacterial vaginosis Treat for BV with Metronidazole 250 mg #21 1 po TID for 7 days with food, no EtOH for 24 hr before and until 72 hr after completing medicine,. No sex for 7 days. Rec use OTC antifungal cream if has itching during or just after antibiotic use. - metroNIDAZOLE (FLAGYL) 250 MG tablet; Take 1 tablet (250 mg total) by mouth 3 (three) times daily.  Dispense: 21 tablet; Refill: 0     No follow-ups on file.  No future appointments.  Jerene Dilling, PA

## 2020-02-09 NOTE — Progress Notes (Signed)
[place] reviewed by provider; per verbal order, dispensed Metronidazole 250 mg PO TID x 7 days. Provider orders completed.

## 2020-02-13 LAB — GONOCOCCUS CULTURE

## 2020-02-14 ENCOUNTER — Encounter: Payer: Self-pay | Admitting: Physician Assistant

## 2020-04-03 ENCOUNTER — Ambulatory Visit: Payer: Medicaid Other

## 2020-04-10 ENCOUNTER — Ambulatory Visit: Payer: Medicaid Other

## 2020-04-18 ENCOUNTER — Ambulatory Visit: Payer: Medicaid Other | Admitting: Physician Assistant

## 2020-04-18 ENCOUNTER — Other Ambulatory Visit: Payer: Self-pay

## 2020-04-18 DIAGNOSIS — N760 Acute vaginitis: Secondary | ICD-10-CM

## 2020-04-18 DIAGNOSIS — Z113 Encounter for screening for infections with a predominantly sexual mode of transmission: Secondary | ICD-10-CM | POA: Diagnosis not present

## 2020-04-18 DIAGNOSIS — Z3202 Encounter for pregnancy test, result negative: Secondary | ICD-10-CM

## 2020-04-18 DIAGNOSIS — B9689 Other specified bacterial agents as the cause of diseases classified elsewhere: Secondary | ICD-10-CM | POA: Diagnosis not present

## 2020-04-18 LAB — WET PREP FOR TRICH, YEAST, CLUE
Trichomonas Exam: NEGATIVE
Yeast Exam: NEGATIVE

## 2020-04-18 LAB — PREGNANCY, URINE: Preg Test, Ur: NEGATIVE

## 2020-04-18 MED ORDER — METRONIDAZOLE 250 MG PO TABS: 250.0000 mg | ORAL_TABLET | Freq: Three times a day (TID) | ORAL | 0 refills | Status: AC

## 2020-04-18 NOTE — Progress Notes (Signed)
Here today for STD screening. Accepts bloodwork. Requesting a PT. Gionni Freese, RN  

## 2020-04-18 NOTE — Progress Notes (Signed)
[place] results reviewed by provider C. Hampton, PA. Patient treated for BV per provider orders. Karen Nourse, RN  

## 2020-04-19 ENCOUNTER — Encounter: Payer: Self-pay | Admitting: Physician Assistant

## 2020-04-19 NOTE — Progress Notes (Signed)
[facility] Department STI clinic/screening visit  Subjective:  Karen [name] is a 24 y.o. female being seen today for an STI screening visit. The patient reports they do have symptoms.  Patient reports that they do not desire a pregnancy in the next year.   They reported they are not interested in discussing contraception today.  Patient's last menstrual period was 04/08/2020 (exact date).   Patient has the following medical conditions:   Patient Active Problem List   Diagnosis Date Noted  . 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 SEXUALLY TRANSMITTED DISEASE    HPI  Patient reports that she has had a thick discharge with an odor for 1 week.  Denies other symptoms and requests pregnancy test today due to having "too much saliva" which is a symptom that she has had with previous pregnancies.  Using condoms sometimes as BCM.  See flowsheet for further details and programmatic requirements.    The following portions of the patient's history were reviewed and updated as appropriate: allergies, current medications, past medical history, past social history, past surgical history and problem list.  Objective:  There were no vitals filed for this visit.  Physical Exam Constitutional:      General: She is not in acute distress.    Appearance: Normal appearance.  HENT:     Head: Normocephalic and atraumatic.     Comments: No nits, lice, or hair loss. No cervical, supraclavicular or axillary adenopathy.     Mouth/Throat:     Mouth: Mucous membranes are moist.     Pharynx: Oropharynx is clear. No oropharyngeal exudate or posterior oropharyngeal erythema.  Eyes:     Conjunctiva/sclera: Conjunctivae normal.  Pulmonary:     Effort: Pulmonary effort is normal.  Abdominal:     Palpations: Abdomen is soft. There is no mass.     Tenderness: There is no abdominal tenderness. There is no guarding or rebound.  Genitourinary:  General: Normal vulva.     Rectum: Normal.     Comments: External genitalia/pubic area without nits, lice, edema, erythema, lesions and inguinal adenopathy. Vagina with normal mucosa, small amount of thin, white discharge, pH=>4.5. Cervix without visible lesions. Uterus firm, mobile, nt, no masses, no CMT, no adnexal tenderness or fullness. Musculoskeletal:     Cervical back: Neck supple. No tenderness.  Skin:    General: Skin is warm and dry.     Findings: No bruising, erythema, lesion or rash.  Neurological:     Mental Status: She is alert and oriented to person, place, and time.  Psychiatric:        Mood and Affect: Mood normal.        Behavior: Behavior normal.        Thought Content: Thought content normal.        Judgment: Judgment normal.      Assessment and Plan:  Karen [name] is a 24 y.o. female presenting to the [facility] Department for STI screening  1. Screening for STD (sexually transmitted disease) Patient into clinic with symptoms. Rec condoms with all sex. Await test results.  Counseled that RN will call if needs to RTC for further treatment once results are back.  - Pregnancy, urine - WET PREP FOR TRICH, YEAST, CLUE - Gonococcus culture - Chlamydia/Gonorrhea [place] [facility] - HIV [place] [facility] - Syphilis Serology, [place] [facility]  2. BV (bacterial vaginosis) Will treat for BV based on  s/s and exam findings with Metronidazole 250 mg #21 1 po TID for 7 days with food, no EtOH for 24 hr before and until 72 hr after completing medicine, No sex for 7 days. Rec using OTC antifungal cream if has itching during or just after completing anitbiotic. - metroNIDAZOLE (FLAGYL) 250 MG tablet; Take 1 tablet (250 mg total) by mouth 3 (three) times daily for 7 days.  Dispense: 21 tablet; Refill: 0  3. Pregnancy examination or test, negative result Counseled that PT today means that she was not pregnant [redacted] weeks ago but could be early pregnant if has had  unprotected sex within the last 2 weeks. Rec that patient recheck pregnancy test at home if next period does not start.     No follow-ups on file.  No future appointments.  Matt Holmes, PA

## 2020-04-22 LAB — GONOCOCCUS CULTURE

## 2020-04-25 ENCOUNTER — Ambulatory Visit: Payer: Self-pay

## 2020-04-25 ENCOUNTER — Telehealth: Payer: Self-pay

## 2020-04-25 ENCOUNTER — Other Ambulatory Visit: Payer: Self-pay

## 2020-04-25 DIAGNOSIS — A749 Chlamydial infection, unspecified: Secondary | ICD-10-CM

## 2020-04-25 NOTE — Telephone Encounter (Signed)
TC to patient. Verified ID via password/SS#. Informed of positive chlamydia and need for tx. Instructed to eat before visit and have partner call for tx appt. Appt scheduled.Kaiden Pech, RN    

## 2020-04-25 NOTE — Telephone Encounter (Signed)
TC to patient. Verified ID via password/SS#. Informed of negative RPR.  Other results still pending. [facility] will call with positive results. Hartley Urton, RN  

## 2020-04-27 DIAGNOSIS — A749 Chlamydial infection, unspecified: Secondary | ICD-10-CM

## 2020-04-27 MED ORDER — AZITHROMYCIN 500 MG PO TABS
1000.0000 mg | ORAL_TABLET | Freq: Once | ORAL | Status: AC
  Administered 2020-04-27: 1000 mg via ORAL

## 2020-06-08 ENCOUNTER — Ambulatory Visit: Payer: Medicaid Other

## 2020-07-20 ENCOUNTER — Ambulatory Visit: Payer: Medicaid Other

## 2020-08-03 ENCOUNTER — Ambulatory Visit: Payer: Medicaid Other | Admitting: Advanced Practice Midwife

## 2020-08-03 ENCOUNTER — Other Ambulatory Visit: Payer: Self-pay

## 2020-08-03 ENCOUNTER — Telehealth: Payer: Self-pay | Admitting: Family Medicine

## 2020-08-03 ENCOUNTER — Encounter: Payer: Self-pay | Admitting: Advanced Practice Midwife

## 2020-08-03 DIAGNOSIS — R87610 Atypical squamous cells of undetermined significance on cytologic smear of cervix (ASC-US): Secondary | ICD-10-CM

## 2020-08-03 DIAGNOSIS — F17200 Nicotine dependence, unspecified, uncomplicated: Secondary | ICD-10-CM

## 2020-08-03 DIAGNOSIS — Z113 Encounter for screening for infections with a predominantly sexual mode of transmission: Secondary | ICD-10-CM | POA: Diagnosis not present

## 2020-08-03 DIAGNOSIS — R87619 Unspecified abnormal cytological findings in specimens from cervix uteri: Secondary | ICD-10-CM | POA: Insufficient documentation

## 2020-08-03 DIAGNOSIS — N760 Acute vaginitis: Secondary | ICD-10-CM

## 2020-08-03 LAB — WET PREP FOR TRICH, YEAST, CLUE
Trichomonas Exam: NEGATIVE
Yeast Exam: NEGATIVE

## 2020-08-03 MED ORDER — METRONIDAZOLE 500 MG PO TABS: 500.0000 mg | ORAL_TABLET | Freq: Two times a day (BID) | ORAL | 0 refills | Status: AC

## 2020-08-03 NOTE — Progress Notes (Signed)
[facility] Department STI clinic/screening visit  Subjective:  Karen [name] is a 24 y.o.SBF G2P2 smoker female being seen today for an STI screening visit. The patient reports they do have symptoms.  Patient reports that they do not desire a pregnancy in the next year.   They reported they are not interested in discussing contraception today.  Patient's last menstrual period was 07/07/2020 (approximate).   Patient has the following medical conditions:   Patient Active Problem List   Diagnosis Date Noted  . Morbid obesity (HCC) 193 lbs 08/03/2020  . Abnormal Pap smear of cervix 06/11/19 ASCUS 08/03/2020  . 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 SEXUALLY TRANSMITTED DISEASE    Screening    HPI  Patient reports took Plan B 07/07/20.  Last sex 07/31/20; that was first time with that partner. Smokes Black & Mild's.  Last ETOH 08/01/20 (1/2 bottle [employer]) q weekend.  Last MJ 7 years ago.  Last pap 06/11/19 ASCUS.  C/o malodor to d/c x 1 week and sharp pain inside vagina 1x/wk x 2 wks lasting 5 seconds.  Last HIV test per patient/review of record was 04/18/20 Patient reports last pap was 06/11/19 ASCUS  See flowsheet for further details and programmatic requirements.    The following portions of the patient's history were reviewed and updated as appropriate: allergies, current medications, past medical history, past social history, past surgical history and problem list.  Objective:  There were no vitals filed for this visit.  Physical Exam Vitals and nursing note reviewed.  Constitutional:      Appearance: Normal appearance. She is obese.  HENT:     Head: Normocephalic and atraumatic.     Mouth/Throat:     Mouth: Mucous membranes are moist.     Pharynx: Oropharynx is clear. No oropharyngeal exudate or posterior oropharyngeal erythema.  Eyes:     Conjunctiva/sclera: Conjunctivae normal.  Pulmonary:     Effort: Pulmonary  effort is normal.  Abdominal:     Palpations: Abdomen is soft. There is no mass.     Tenderness: There is no abdominal tenderness. There is no rebound.     Comments: Soft without tenderness, poor tone, increased adipose  Genitourinary:    General: Normal vulva.     Exam position: Lithotomy position.     Pubic Area: No rash or pubic lice.      Labia:        Right: No rash or lesion.        Left: No rash or lesion.      Vagina: Normal. No vaginal discharge (grey creamy leukorrhea, ph<4.5), erythema, bleeding or lesions.     Cervix: Normal.     Uterus: Normal.      Adnexa: Right adnexa normal and left adnexa normal.     Rectum: Normal.  Lymphadenopathy:     Head:     Right side of head: No preauricular or posterior auricular adenopathy.     Left side of head: No preauricular or posterior auricular adenopathy.     Cervical: No cervical adenopathy.     Upper Body:     Right upper body: No supraclavicular or axillary adenopathy.     Left upper body: No supraclavicular or axillary adenopathy.     Lower Body: No right inguinal adenopathy. No left inguinal adenopathy.  Skin:    General: Skin is warm and dry.     Findings: No rash.  Neurological:     Mental Status: She is alert and oriented to person, place, and time.      Assessment and Plan:  TASHEENA WAMBOLT is a 24 y.o. female presenting to the [facility] Department for STI screening  1. Morbid obesity (HCC) 193 lbs  2. Screening examination for venereal disease Treat wet mount per standing orders Immunization nurse consult  - WET PREP FOR TRICH, YEAST, CLUE - Gonococcus culture - HIV [place] [facility] - Chlamydia/Gonorrhea [place] - Syphilis Serology, [facility]  3. Atypical squamous cells of undetermined significance on cytologic smear of cervix (ASC-US) Needs pap ASAP--please schedule     No follow-ups on file.  No future appointments.  Alberteen Spindle, CNM

## 2020-08-03 NOTE — Telephone Encounter (Signed)
Pt is returning a nurses call.

## 2020-08-03 NOTE — Progress Notes (Signed)
Wet mount reviewed by provider; per verbal by Arnetha Courser, CNM, pt treated for BV per standing order. Pt scheduled for [facility] physical/pap on 08/08/2020. Provider orders completed.

## 2020-08-03 NOTE — Progress Notes (Signed)
Here today for STD screening. Accepts bloodwork. Alfonsa Vaile, RN ° °

## 2020-08-03 NOTE — Telephone Encounter (Signed)
Pt scheduled for physical/pap on 08/08/2020.

## 2020-08-08 ENCOUNTER — Ambulatory Visit: Payer: Self-pay

## 2020-08-08 LAB — GONOCOCCUS CULTURE

## 2020-08-09 ENCOUNTER — Telehealth: Payer: Self-pay | Admitting: Family Medicine

## 2020-08-09 NOTE — Telephone Encounter (Signed)
Pt. is calling about Std results.

## 2020-08-09 NOTE — Telephone Encounter (Signed)
Returned TC to patient who is requesting test results. Patient identity verified. Patient counseled that most labs not resulted yet, but that gonorrhea culture result is negative. Patient counseled to call back later next week if she has more questions. Patient states she does have MyChart. Patient reminded that someone from [facility] will call for any abnormal results. Patient states understanding.Burt Knack, RN

## 2020-08-11 ENCOUNTER — Encounter: Payer: Self-pay | Admitting: Advanced Practice Midwife

## 2020-08-21 ENCOUNTER — Telehealth: Payer: Self-pay | Admitting: Family Medicine

## 2020-08-21 NOTE — Telephone Encounter (Signed)
CALLING FOR STD RESULTS. 

## 2020-08-21 NOTE — Telephone Encounter (Signed)
Phone call to pt. Pt knew password from last visit. Pt counseled about the results scanned in at this point; it can take up to 3 weeks for all results. Pt states she has My Chart.

## 2020-09-20 ENCOUNTER — Ambulatory Visit: Payer: Medicaid Other

## 2020-09-21 ENCOUNTER — Ambulatory Visit: Payer: Medicaid Other

## 2020-10-29 ENCOUNTER — Emergency Department: Payer: Medicaid Other

## 2020-10-29 ENCOUNTER — Other Ambulatory Visit: Payer: Self-pay

## 2020-10-29 ENCOUNTER — Emergency Department
Admission: EM | Admit: 2020-10-29 | Discharge: 2020-10-29 | Disposition: A | Discharge status: Home / Self Care | Payer: Medicaid Other | Attending: Emergency Medicine | Admitting: Emergency Medicine

## 2020-10-29 DIAGNOSIS — R102 Pelvic and perineal pain: Secondary | ICD-10-CM | POA: Insufficient documentation

## 2020-10-29 DIAGNOSIS — R1030 Lower abdominal pain, unspecified: Secondary | ICD-10-CM | POA: Insufficient documentation

## 2020-10-29 DIAGNOSIS — Z87891 Personal history of nicotine dependence: Secondary | ICD-10-CM | POA: Insufficient documentation

## 2020-10-29 DIAGNOSIS — R1032 Left lower quadrant pain: Secondary | ICD-10-CM

## 2020-10-29 DIAGNOSIS — B9689 Other specified bacterial agents as the cause of diseases classified elsewhere: Secondary | ICD-10-CM

## 2020-10-29 DIAGNOSIS — N760 Acute vaginitis: Secondary | ICD-10-CM

## 2020-10-29 LAB — URINALYSIS, COMPLETE (UACMP) WITH MICROSCOPIC
Bacteria, UA: NONE SEEN
Bilirubin Urine: NEGATIVE
Glucose, UA: NEGATIVE mg/dL
Hgb urine dipstick: NEGATIVE
Ketones, ur: NEGATIVE mg/dL
Nitrite: NEGATIVE
Protein, ur: NEGATIVE mg/dL
Specific Gravity, Urine: 1.024 (ref 1.005–1.030)
pH: 6 (ref 5.0–8.0)

## 2020-10-29 LAB — POC URINE PREG, ED: Preg Test, Ur: NEGATIVE

## 2020-10-29 LAB — COMPREHENSIVE METABOLIC PANEL
ALT: 18 U/L (ref 0–44)
AST: 18 U/L (ref 15–41)
Albumin: 4.2 g/dL (ref 3.5–5.0)
Alkaline Phosphatase: 56 U/L (ref 38–126)
Anion gap: 9 (ref 5–15)
BUN: 10 mg/dL (ref 6–20)
CO2: 24 mmol/L (ref 22–32)
Calcium: 9 mg/dL (ref 8.9–10.3)
Chloride: 104 mmol/L (ref 98–111)
Creatinine, Ser: 0.73 mg/dL (ref 0.44–1.00)
GFR, Estimated: >60 mL/min (ref >60)
Glucose, Bld: 97 mg/dL (ref 70–99)
Potassium: 3.7 mmol/L (ref 3.5–5.1)
Sodium: 137 mmol/L (ref 135–145)
Total Bilirubin: 0.6 mg/dL (ref 0.3–1.2)
Total Protein: 7.4 g/dL (ref 6.5–8.1)

## 2020-10-29 LAB — CBC
HCT: 36.5 % (ref 36.0–46.0)
Hemoglobin: 12 g/dL (ref 12.0–15.0)
MCH: 28.6 pg (ref 26.0–34.0)
MCHC: 32.9 g/dL (ref 30.0–36.0)
MCV: 86.9 fL (ref 80.0–100.0)
Platelets: 275 10*3/uL (ref 150–400)
RBC: 4.2 MIL/uL (ref 3.87–5.11)
RDW: 13.6 % (ref 11.5–15.5)
WBC: 9.7 10*3/uL (ref 4.0–10.5)
nRBC: 0 % (ref 0.0–0.2)

## 2020-10-29 LAB — CHLAMYDIA/NGC RT PCR (ARMC ONLY)
Chlamydia Tr: NOT DETECTED
N gonorrhoeae: NOT DETECTED

## 2020-10-29 LAB — WET PREP, GENITAL
Sperm: NONE SEEN
Trich, Wet Prep: NONE SEEN
Yeast Wet Prep HPF POC: NONE SEEN

## 2020-10-29 LAB — HCG, QUANTITATIVE, PREGNANCY: hCG, Beta Chain, Quant, S: <1 m[IU]/mL (ref <5)

## 2020-10-29 LAB — LIPASE, BLOOD: Lipase: 26 U/L (ref 11–51)

## 2020-10-29 MED ORDER — METRONIDAZOLE 500 MG PO TABS: 500.0000 mg | ORAL_TABLET | Freq: Two times a day (BID) | ORAL | 0 refills | Status: DC

## 2020-10-29 MED ORDER — CLINDAMYCIN HCL 300 MG PO CAPS: 300.0000 mg | ORAL_CAPSULE | Freq: Three times a day (TID) | ORAL | 0 refills | Status: AC

## 2020-10-29 NOTE — ED Provider Notes (Signed)
[facility] Emergency Department Provider Note  Time seen: 12:08 PM  I have reviewed the triage vital signs and the nursing notes.   HISTORY  Chief Complaint Abdominal Pain   HPI Karen [name] is a 24 y.o. female with no significant past medical history presents to the emergency department for lower abdominal/pelvic pain.  According to the patient since last night she has been experiencing fairly severe left lower pelvis pain.  Patient states minimal vaginal discharge.  Denies vaginal bleeding.  Denies dysuria or hematuria.  No known history of cysts.  Denies any fever cough or shortness of breath.   Patient denies vomiting or diarrhea does state mild nausea but states that is fairly chronic for her.  Patient is not sure of her last menstrual period but believes it to be 2 months ago.  Past Medical History:  Diagnosis Date  . 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 Medical history non-contributory    deafness in left ear    Patient Active Problem List   Diagnosis Date Noted  . Morbid obesity (HCC) 193 lbs 08/03/2020  . Abnormal Pap smear of cervix 06/11/19 ASCUS 08/03/2020  . Smoker Black & Milds 08/03/2020  . 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 NO PAST SURGERIES      Prior to Admission medications   Medication Sig Start Date End Date Taking? Authorizing Provider  acetaminophen (TYLENOL) 500 MG tablet Take 500 mg by mouth every 6 (six) hours as needed for headache (pain).     [provider]  calcium carbonate (TUMS - DOSED IN MG ELEMENTAL CALCIUM) 500 MG chewable tablet Chew 24 tablets by mouth as needed for indigestion or heartburn. Patient not taking: Reported on 08/03/2020     [provider]  ibuprofen (ADVIL) 100 MG/5ML suspension Take 30 mLs (600 mg total) by mouth every 6 (six) hours as needed for mild pain or moderate pain. Patient not taking: Reported on 04/18/2020 11/09/19   Donette Larry, CNM  metroNIDAZOLE (FLAGYL) 250 MG tablet Take 1 tablet (250 mg total) by mouth 3 (three) times daily. Patient not taking: Reported on 04/18/2020 02/09/20   Matt Holmes, PA  Prenatal 28-0.8 MG TABS Take 1 tablet by mouth daily. Patient not taking: Reported on 08/03/2020    [provider]    No Known Allergies  History reviewed. No pertinent family history.  Social History Social History   Tobacco Use  . Smoking status: Former Smoker    Types: Cigars    Quit date: 08/03/2020    Years since quitting: 0.2  . Smokeless tobacco: Never Used  Vaping Use  . Vaping Use: Never used  Substance Use Topics  . Alcohol use: Yes    Comment: q weekend 1/2 bottle [employer]  . Drug use: Not Currently    Types: Marijuana    Review of Systems Constitutional: Negative for fever. Cardiovascular: Negative for chest pain. Respiratory: Negative for shortness of breath. Gastrointestinal: Positive for lower abdominal pain, moderate dull aching mostly in the left lower. Genitourinary: Negative for urinary compaints.  Mild discharge. Musculoskeletal: Negative for musculoskeletal complaints Neurological: Negative for headache All other ROS negative  ____________________________________________   PHYSICAL  EXAM:  VITAL SIGNS: ED Triage Vitals  Enc Vitals Group     BP 10/29/20 1124 133/81     Pulse Rate 10/29/20 1124 85     Resp 10/29/20 1124 18     Temp 10/29/20 1124 98.5 F (36.9 C)     Temp Source 10/29/20 1124 Oral     SpO2 10/29/20 1124 98 %     Weight 10/29/20 1125 174 lb (78.9 kg)     Height 10/29/20 1125 5' (1.524 m)     Head Circumference --      Peak Flow --      Pain Score 10/29/20 1124 7     Pain Loc --      Pain Edu? --      Excl. in  GC? --    Constitutional: Alert and oriented. Well appearing and in no distress. Eyes: Normal exam ENT      Head: Normocephalic and atraumatic.      Mouth/Throat: Mucous membranes are moist. Cardiovascular: Normal rate, regular rhythm Respiratory: Normal respiratory effort without tachypnea nor retractions. Breath sounds are clear  Gastrointestinal: Soft, mild to moderate lower abdominal tenderness especially in the left pelvis area and mild suprapubic tenderness.  No right lower quadrant tenderness.  No tenderness over McBurney's point.  Upper abdomen is benign.  No distention.  No rebound or guarding. Musculoskeletal: Nontender with normal range of motion in all extremities.  Neurologic:  Normal speech and language. No gross focal neurologic deficits  Skin:  Skin is warm, dry and intact.  Psychiatric: Mood and affect are normal.  ____________________________________________     RADIOLOGY  Ultrasounds are negative.  ____________________________________________   INITIAL IMPRESSION / ASSESSMENT AND PLAN / ED COURSE  Pertinent labs & imaging results that were available during my care of the patient were reviewed by me and considered in my medical decision making (see chart for details).   Patient presents emergency department for lower abdominal pain mostly in the left pelvis.  Patient states has been 8-month since her last period.  Differential would include ectopic pregnancy, ovarian cyst, hemorrhagic cyst, TOA, pelvic infection, urinary infection, colitis, diverticulitis or other intra-abdominal pathology.  We will check labs, urinalysis perform a pelvic exam and obtain a pelvic ultrasound to further evaluate.  Patient agreeable to plan of care.  Imaging is essentially normal.  Lab work is reassuring.  Urinalysis is reassuring.  Pelvic exam shows a mild amount of discharge.  No cervical erythema or tenderness.  Swabs have been sent.  Patient now states that she took a pregnancy test  about 10 days ago and one of them was positive and 1 was negative.  Patient's urine pregnancy test in the emergency department today is negative but I have added on a beta hCG quantitative as a precaution.  Patient states she is feeling better.  Patient's wet prep is positive for clue cells we will discharge with Flagyl.  Patient's quantitative hCG is negative.  Karen [name] was evaluated in Emergency Department on 10/29/2020 for the symptoms described in the history of present illness. She was evaluated in the context of the global COVID-19 pandemic, which necessitated consideration that the patient might be at risk for infection with the SARS-CoV-2 virus that causes COVID-19. Institutional protocols and algorithms that pertain to the evaluation of patients at risk for COVID-19 are in a state of rapid change based on information released by regulatory bodies including the CDC and federal and state organizations. These policies and algorithms were followed  during the patient's care in the ED.  ____________________________________________   FINAL CLINICAL IMPRESSION(S) / ED DIAGNOSES  Left pelvic pain   Minna Antis, MD 10/29/20 1442

## 2020-10-29 NOTE — ED Triage Notes (Signed)
Pt states she started having lower abdominal pain last night and it was still there this morning although better- pt states it is lower abdomen- pt denies v/d but has had a little bit of nausea

## 2020-10-29 NOTE — ED Notes (Signed)
Pt back from US

## 2020-12-28 ENCOUNTER — Other Ambulatory Visit: Payer: Self-pay

## 2020-12-28 ENCOUNTER — Encounter: Payer: Self-pay | Admitting: Advanced Practice Midwife

## 2020-12-28 ENCOUNTER — Ambulatory Visit: Payer: Medicaid Other | Admitting: Advanced Practice Midwife

## 2020-12-28 VITALS — BP 124/76 | Wt 173.8 lb

## 2020-12-28 DIAGNOSIS — Z113 Encounter for screening for infections with a predominantly sexual mode of transmission: Secondary | ICD-10-CM

## 2020-12-28 DIAGNOSIS — Z3200 Encounter for pregnancy test, result unknown: Secondary | ICD-10-CM

## 2020-12-28 LAB — WET PREP FOR TRICH, YEAST, CLUE
Trichomonas Exam: NEGATIVE
Yeast Exam: NEGATIVE

## 2020-12-28 LAB — PREGNANCY, URINE: Preg Test, Ur: POSITIVE — AB

## 2020-12-28 MED ORDER — PRENATAL VITAMINS 28-0.8 MG PO TABS: 1.0000 | ORAL_TABLET | Freq: Every day | ORAL | 1 refills | Status: AC

## 2020-12-28 MED ORDER — PRENATAL VITAMINS 28-0.8 MG PO TABS: 1.0000 | ORAL_TABLET | ORAL | 0 refills | Status: DC

## 2020-12-28 NOTE — Progress Notes (Signed)
Post:  RN reviewed positive pregnancy test with patient. Provided pregnancy resource sheet and proof of pregnancy. All questions answered.   Harvie Heck, RN

## 2020-12-28 NOTE — Progress Notes (Signed)
[facility] Department STI clinic/screening visit  Subjective:  Karen [name] is a 24 y.o.SBF G2P2 smoker female being seen today for an STI screening visit. The patient reports they do have symptoms.  Patient reports that they do not desire a pregnancy in the next year.   They reported they are not interested in discussing contraception today.  Patient's last menstrual period was 11/09/2020.   Patient has the following medical conditions:   Patient Active Problem List   Diagnosis Date Noted  . Morbid obesity (HCC) 193 lbs 08/03/2020  . Abnormal Pap smear of cervix 06/11/19 ASCUS 08/03/2020  . Smoker Black & Milds 08/03/2020  . Hearing loss in left ear 06/11/2019    No chief complaint on file.   HPI  Patient reports malodor x 4 days.  LMP 11/03/20.  Last sex 12/27/20 without condom; with current partner first time; 3 sex partners in last 3 mo.  Last MJ 8 years ago.  Last ETOH 12/27/20 (6 shots liquor)q weekend.  Smokes cigars  Last HIV test per patient/review of record was 08/03/20 Patient reports last pap was 06/11/2019 ASCUS  See flowsheet for further details and programmatic requirements.    The following portions of the patient's history were reviewed and updated as appropriate: allergies, current medications, past medical history, past social history, past surgical history and problem list.  Objective:  There were no vitals filed for this visit.  Physical Exam Vitals and nursing note reviewed.  Constitutional:      Appearance: Normal appearance. She is obese.  HENT:     Head: Normocephalic and atraumatic.     Mouth/Throat:     Mouth: Mucous membranes are moist.     Pharynx: Oropharynx is clear. No oropharyngeal exudate or posterior oropharyngeal erythema.  Eyes:     Conjunctiva/sclera: Conjunctivae normal.  Pulmonary:     Effort: Pulmonary effort is normal.  Chest:  Breasts:     Right: No axillary adenopathy or supraclavicular adenopathy.     Left:  No axillary adenopathy or supraclavicular adenopathy.    Abdominal:     Palpations: Abdomen is soft. There is no mass.     Tenderness: There is no abdominal tenderness. There is no rebound.     Comments: Poor tone, soft without tenderness, increased adipose  Genitourinary:    General: Normal vulva.     Exam position: Lithotomy position.     Pubic Area: No rash or pubic lice.      Labia:        Right: No rash or lesion.        Left: No rash or lesion.      Vagina: Vaginal discharge (frothy white leukorrhea, ph<4.5) present. No erythema, bleeding or lesions.     Cervix: Normal.     Uterus: Normal.      Adnexa: Right adnexa normal and left adnexa normal.     Rectum: Normal.  Lymphadenopathy:     Head:     Right side of head: No preauricular or posterior auricular adenopathy.     Left side of head: No preauricular or posterior auricular adenopathy.     Cervical: No cervical adenopathy.     Upper Body:     Right upper body: No supraclavicular or axillary adenopathy.     Left upper body: No supraclavicular or axillary adenopathy.     Lower Body: No right inguinal adenopathy. No left inguinal adenopathy.  Skin:    General: Skin is warm and dry.  Findings: No rash.  Neurological:     Mental Status: She is alert and oriented to person, place, and time.      Assessment and Plan:  Karen [name] is a 24 y.o. female presenting to the [facility] Department for STI screening  1. Screening examination for venereal disease Treat wet mount per standing orders Immunization nurse consult - WET PREP FOR TRICH, YEAST, CLUE - Pregnancy, urine - Chlamydia/Gonorrhea [place] [facility] - Syphilis Serology, [place] [facility] - HIV [place] [facility]     Return for PRN.  No future appointments.  Alberteen Spindle, CNM

## 2021-01-02 ENCOUNTER — Other Ambulatory Visit: Payer: Self-pay

## 2021-01-02 ENCOUNTER — Encounter: Payer: Self-pay | Admitting: *Deleted

## 2021-01-02 DIAGNOSIS — Z3A09 9 weeks gestation of pregnancy: Secondary | ICD-10-CM | POA: Diagnosis not present

## 2021-01-02 DIAGNOSIS — Z5321 Procedure and treatment not carried out due to patient leaving prior to being seen by health care provider: Secondary | ICD-10-CM | POA: Diagnosis not present

## 2021-01-02 DIAGNOSIS — O26891 Other specified pregnancy related conditions, first trimester: Secondary | ICD-10-CM | POA: Insufficient documentation

## 2021-01-02 DIAGNOSIS — R42 Dizziness and giddiness: Secondary | ICD-10-CM | POA: Diagnosis not present

## 2021-01-02 LAB — BASIC METABOLIC PANEL
Anion gap: 9 (ref 5–15)
BUN: 8 mg/dL (ref 6–20)
CO2: 23 mmol/L (ref 22–32)
Calcium: 9 mg/dL (ref 8.9–10.3)
Chloride: 107 mmol/L (ref 98–111)
Creatinine, Ser: 0.74 mg/dL (ref 0.44–1.00)
GFR, Estimated: >60 mL/min (ref >60)
Glucose, Bld: 138 mg/dL — ABNORMAL HIGH (ref 70–99)
Potassium: 3.7 mmol/L (ref 3.5–5.1)
Sodium: 139 mmol/L (ref 135–145)

## 2021-01-02 LAB — HCG, QUANTITATIVE, PREGNANCY: hCG, Beta Chain, Quant, S: 1190 m[IU]/mL — ABNORMAL HIGH (ref <5)

## 2021-01-02 LAB — GONOCOCCUS CULTURE

## 2021-01-02 LAB — CBC
HCT: 37 % (ref 36.0–46.0)
Hemoglobin: 12.2 g/dL (ref 12.0–15.0)
MCH: 28.2 pg (ref 26.0–34.0)
MCHC: 33 g/dL (ref 30.0–36.0)
MCV: 85.6 fL (ref 80.0–100.0)
Platelets: 240 10*3/uL (ref 150–400)
RBC: 4.32 MIL/uL (ref 3.87–5.11)
RDW: 13.1 % (ref 11.5–15.5)
WBC: 7.7 10*3/uL (ref 4.0–10.5)
nRBC: 0 % (ref 0.0–0.2)

## 2021-01-02 NOTE — ED Triage Notes (Signed)
Pt to triage via wheelchair.  Pt reports feeling lightheaded and dizzy for 5 days.  Pt is approx [redacted] weeks pregnant.   No n/v.  No vag bleeding.  No abd pain.  Pt alert  Speech clear.

## 2021-01-03 ENCOUNTER — Emergency Department
Admission: EM | Admit: 2021-01-03 | Discharge: 2021-01-03 | Disposition: A | Discharge status: Home / Self Care | Payer: Medicaid Other | Attending: Emergency Medicine | Admitting: Emergency Medicine

## 2021-01-03 NOTE — ED Notes (Signed)
No answer when called several times from lobby 

## 2021-01-04 LAB — HM HIV SCREENING LAB: HM HIV Screening: NEGATIVE

## 2021-01-07 ENCOUNTER — Encounter: Payer: Self-pay | Admitting: Student

## 2021-01-09 ENCOUNTER — Telehealth: Payer: Self-pay | Admitting: Family Medicine

## 2021-01-09 NOTE — Telephone Encounter (Signed)
Patient has question about results she is seeing on MyChart.

## 2021-01-10 NOTE — Telephone Encounter (Signed)
Phone call returned to pt. Pt states she saw "negative" test results in My Chart and just wanted to confirm what that meant. Counseled pt about what a negative result indicates. No further assistance or appts needed at this time.

## 2021-02-17 ENCOUNTER — Emergency Department: Payer: Medicaid Other

## 2021-02-17 ENCOUNTER — Encounter: Payer: Self-pay | Admitting: Radiology

## 2021-02-17 ENCOUNTER — Emergency Department
Admission: EM | Admit: 2021-02-17 | Discharge: 2021-02-17 | Disposition: A | Discharge status: Home / Self Care | Payer: Medicaid Other | Attending: Emergency Medicine | Admitting: Emergency Medicine

## 2021-02-17 ENCOUNTER — Other Ambulatory Visit: Payer: Self-pay

## 2021-02-17 DIAGNOSIS — S46911A Strain of unspecified muscle, fascia and tendon at shoulder and upper arm level, right arm, initial encounter: Secondary | ICD-10-CM

## 2021-02-17 DIAGNOSIS — S4351XA Sprain of right acromioclavicular joint, initial encounter: Secondary | ICD-10-CM | POA: Insufficient documentation

## 2021-02-17 DIAGNOSIS — F17290 Nicotine dependence, other tobacco product, uncomplicated: Secondary | ICD-10-CM | POA: Insufficient documentation

## 2021-02-17 DIAGNOSIS — S4991XA Unspecified injury of right shoulder and upper arm, initial encounter: Secondary | ICD-10-CM | POA: Diagnosis present

## 2021-02-17 DIAGNOSIS — R52 Pain, unspecified: Secondary | ICD-10-CM

## 2021-02-17 MED ORDER — KETOROLAC TROMETHAMINE 30 MG/ML IJ SOLN
30.0000 mg | Freq: Once | INTRAMUSCULAR | Status: AC
  Administered 2021-02-17: 30 mg via INTRAMUSCULAR
  Filled 2021-02-17: qty 1

## 2021-02-17 MED ORDER — ACETAMINOPHEN 325 MG PO TABS: 650.0000 mg | ORAL_TABLET | Freq: Once | ORAL | Status: DC

## 2021-02-17 NOTE — Discharge Instructions (Signed)
Take the prescription muscle relaxant as needed. Take OTC Ibuprofen as needed for pain relief. Follow-up with your provider, [facility], or Ortho for continued symptoms. Apply ice to reduce syReturn if needed.
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
## 2022-05-23 ENCOUNTER — Encounter: Payer: Medicaid Other | Admitting: Obstetrics

## 2022-05-23 DIAGNOSIS — Z3200 Encounter for pregnancy test, result unknown: Secondary | ICD-10-CM
